# Patient Record
Sex: Male | Born: 1975 | Race: Asian | Hispanic: No | Marital: Married | Smoking: Never smoker
Health system: Southern US, Community
[De-identification: ages and names within clinical notes are randomized; demographics above are authoritative.]

## PROBLEM LIST (undated history)

## (undated) DIAGNOSIS — K5732 Diverticulitis of large intestine without perforation or abscess without bleeding: Secondary | ICD-10-CM

## (undated) DIAGNOSIS — M549 Dorsalgia, unspecified: Secondary | ICD-10-CM

---

## 2005-07-02 ENCOUNTER — Ambulatory Visit: Payer: Self-pay | Admitting: Family Medicine

## 2015-03-20 ENCOUNTER — Ambulatory Visit: Payer: BLUE CROSS/BLUE SHIELD | Attending: Orthopaedic Surgery | Admitting: Physical Therapy

## 2015-03-20 DIAGNOSIS — R29898 Other symptoms and signs involving the musculoskeletal system: Secondary | ICD-10-CM

## 2015-03-20 DIAGNOSIS — M546 Pain in thoracic spine: Secondary | ICD-10-CM

## 2015-03-20 DIAGNOSIS — M545 Low back pain, unspecified: Secondary | ICD-10-CM

## 2015-03-20 NOTE — Therapy (Signed)
[facility] [place] [address]  [address] [place], [place], [address] Phone: [phone]   Fax:  [phone]  Physical Therapy Evaluation  Patient Details  Name: Jonathan Jocksif Guillet MRN: [id number] Date of Birth: 10-09-1976 Referring Provider:  Tarry KosXu, Naiping M, MD  Encounter Date: 03/20/2015      PT End of Session - 03/20/15 1245    Visit Number 1   Number of Visits 8   Date for PT Re-Evaluation 04/17/15   PT Start Time 1027   PT Stop Time 1106   PT Time Calculation (min) 39 min   Activity Tolerance Patient tolerated treatment well   Behavior During Therapy [facility] for tasks assessed/performed      No past medical history on file.  No past surgical history on file.  There were no vitals filed for this visit.  Visit Diagnosis:  Bilateral thoracic back pain - Plan: PT plan of care cert/re-cert  Bilateral low back pain without sciatica - Plan: PT plan of care cert/re-cert  Weakness of both lower extremities - Plan: PT plan of care cert/re-cert      Subjective Assessment - 03/20/15 1028    Subjective Pt is a 39 y/o male who presents to OPPT with sudden onset of LBP on 03/07/15.  Pt reports he developed sudden onset with inability to work.  Pt unable to recall reason for onset.  Pt reports improved symptoms x past 2 weeks.   Limitations House hold activities;Other (comment)  bending on knees; praying 5 times a day   Diagnostic tests x-rays negative   Patient Stated Goals improve strength in back; decrease risk of reinjury   Currently in Pain? No/denies   Pain Location Back   Pain Descriptors / Indicators Discomfort   Pain Onset 1 to 4 weeks ago   Pain Frequency Intermittent   Aggravating Factors  bending over (prays 5x/day with bending)   Pain Relieving Factors medication            [facility] PT Assessment - 03/20/15 1033    Assessment   Medical Diagnosis LBP   Onset Date 03/07/15   Next MD Visit next week   Prior Therapy none  for back   Precautions   Precautions None   Restrictions   Weight Bearing Restrictions No   Balance Screen   Has the patient fallen in the past 6 months No   Has the patient had a decrease in activity level because of a fear of falling?  No  except on treadmill   Is the patient reluctant to leave their home because of a fear of falling?  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#; part time student for logistics management   Leisure watch TV; go to park   Cognition   Overall Cognitive Status Within Functional Limits for tasks assessed   Observation/Other Assessments   Focus on Therapeutic Outcomes (FOTO)  56 (44% limited; predicted 25% limited)   Posture/Postural Control   Posture/Postural Control Postural limitations   Postural Limitations Rounded Shoulders;Forward head;Decreased lumbar lordosis   AROM   Overall AROM Comments Lumbar WNL; min symptoms with L rotation   Strength   Strength Assessment Site Hip;Knee   Right Hip Flexion 5/5   Right Hip Extension 3/5   Right Hip ABduction 4/5   Left Hip  Flexion 5/5   Left Hip Extension 3/5   Left Hip ABduction 4/5   Right/Left Knee Right;Left   Right Knee Flexion 4/5   Right Knee Extension 5/5   Left Knee Flexion 4/5   Left Knee Extension 5/5   Palpation   Palpation tenderness and tightness noted L thoracic paraspinals   Special Tests    Special Tests Lumbar   Straight Leg Raise   Findings Negative   Comment bil                   [facility] Adult PT Treatment/Exercise - 03/20/15 1059    Modalities   Modalities Electrical Stimulation;Moist Heat   Moist Heat Therapy   Number Minutes Moist Heat 15 Minutes   Moist Heat Location Other (comment)  low back   Electrical Stimulation   Electrical  Stimulation Location L thoracic/lumbar spine   Electrical Stimulation Action IFC   Electrical Stimulation Parameters to tolerance x 15 min   Electrical Stimulation Goals Pain                     PT Long Term Goals - 03/20/15 1248    PT LONG TERM GOAL #1   Title independent with HEP (04/10/15)   Time 3   Period Weeks   Status New   PT LONG TERM GOAL #2   Title verbalize understanding of posture/body mechanics to reduce the risk of reinjury (04/10/15)   Time 3   Period Weeks   Status New   PT LONG TERM GOAL #3   Title report ability to work full day without increase in pain (04/10/15)   Time 3   Period Weeks   Status New   PT LONG TERM GOAL #4   Title report ability to perform daily routines including praying 5 times a day without increase in pain (04/10/15)   Time 3   Period Weeks   Status New               Plan - 03/20/15 1246    Clinical Impression Statement Pt presents to OPPT with improving low back pain without radiculopathy.  Pt demonstrates some muscle tightness and soreness as well as hip and core stability weakness.  Pt will benefit from PT to improve strength and function as well as learn how to decrease risk of reinury.   Pt will benefit from skilled therapeutic intervention in order to improve on the following deficits Postural dysfunction;Pain;Decreased strength;Decreased activity tolerance;Improper body mechanics   Rehab Potential Good   PT Frequency 2x / week   PT Duration 3 weeks   PT Treatment/Interventions ADLs/Self Care Home Management;Electrical Stimulation;Moist Heat;Cryotherapy;Traction;Neuromuscular re-education;Ultrasound;Therapeutic exercise;Manual techniques;Therapeutic activities;Functional mobility training;Patient/family education   PT Next Visit Plan HEP for core, hip strengthening and low back flexibility   Consulted and Agree with Plan of Care Patient         Problem List There are no active problems to display for this  patient.  Clarita CraneStephanie F Alexius Hangartner, PT, DPT 03/20/2015 12:52 PM  [facility] [place] [address]  [address] [place], [place], [address] Phone: [phone]   Fax:  [phone]

## 2015-03-23 ENCOUNTER — Ambulatory Visit: Payer: BLUE CROSS/BLUE SHIELD | Admitting: Physical Therapy

## 2015-03-23 DIAGNOSIS — M545 Low back pain, unspecified: Secondary | ICD-10-CM

## 2015-03-23 DIAGNOSIS — M546 Pain in thoracic spine: Secondary | ICD-10-CM

## 2015-03-23 DIAGNOSIS — R29898 Other symptoms and signs involving the musculoskeletal system: Secondary | ICD-10-CM

## 2015-03-23 NOTE — Patient Instructions (Signed)
Knee to Chest   Lying supine, bend knee to chest and hold 30 seconds.  Perform _3__ times. Repeat with other leg. Do _1-3__ times per day.  Copyright  VHI. All rights reserved.   Double Knee to Chest (Flexion)   Gently pull both knees toward chest. Feel stretch in lower back or buttock area. Breathing deeply, Hold _30___ seconds. Repeat __3__ times. Do __1-3__ sessions per day.  http://gt2.exer.us/227   Copyright  VHI. All rights reserved.   Lower Trunk Rotation Stretch   Keeping back flat and feet together, rotate knees to left side. Hold _30___ seconds. Repeat __3__ times per set. Do _1___ sets per session. Do _1-3___ sessions per day.  http://orth.exer.us/122   Copyright  VHI. All rights reserved.   Pelvic Tilt (Flexion)   With feet flat and knees bent, flatten lower back into bed. Tighten stomach muscles. Hold _5___ seconds. Repeat _10___ times. Do __1-3__ sessions per day.  http://gt2.exer.us/229   Copyright  VHI. All rights reserved.   Jonathan CraneStephanie F Karle Buckley, PT, DPT 03/23/2015 9:00 AM  [facility] [place] [address] [place], [place] [address]  [phone] (office) [phone] (fax)

## 2015-03-23 NOTE — Therapy (Signed)
[facility] [place] [address]  [address] [place], [place], [address] Phone: [phone]   Fax:  [phone]  Physical Therapy Treatment  Patient Details  Name: Jonathan Buckley MRN: [id number] Date of Birth: 01/04/1976 Referring Provider:  Tarry KosXu, Naiping M, MD  Encounter Date: 03/23/2015      PT End of Session - 03/23/15 0911    Visit Number 2   Number of Visits 8   Date for PT Re-Evaluation 04/17/15   PT Start Time 0842   PT Stop Time 0927   PT Time Calculation (min) 45 min   Activity Tolerance Patient tolerated treatment well   Behavior During Therapy [facility] for tasks assessed/performed      No past medical history on file.  No past surgical history on file.  There were no vitals filed for this visit.  Visit Diagnosis:  Bilateral thoracic back pain  Bilateral low back pain without sciatica  Weakness of both lower extremities      Subjective Assessment - 03/23/15 0843    Subjective Has had pain since yesterday afternoon; reports increased bending 2 days ago.   Patient Stated Goals improve strength in back; decrease risk of reinjury   Currently in Pain? Yes   Pain Score 8    Pain Location Back   Pain Orientation Left;Mid;Lower   Pain Onset Today   Pain Frequency Intermittent   Aggravating Factors  bending   Pain Relieving Factors meds                         [facility] Adult PT Treatment/Exercise - 03/23/15 0848    Lumbar Exercises: Stretches   Single Knee to Chest Stretch 3 reps;30 seconds   Double Knee to Chest Stretch 3 reps;30 seconds   Lower Trunk Rotation 3 reps;30 seconds   Lumbar Exercises: Aerobic   Stationary Bike NuStep L6 x 8 min   Lumbar Exercises: Supine   Ab Set 10 reps;5 seconds   Modalities   Modalities Electrical Stimulation;Moist Heat   Moist Heat Therapy   Number Minutes Moist Heat 15 Minutes   Moist Heat Location Lumbar Spine   Electrical Stimulation   Electrical Stimulation  Location L thoracic/lumbar spine   Electrical Stimulation Action IFC   Electrical Stimulation Parameters to tolerance x 15 min   Electrical Stimulation Goals Pain                PT Education - 03/23/15 0911    Education provided Yes   Education Details HEP   Person(s) Educated Patient   Methods Explanation;Demonstration;Handout   Comprehension Verbalized understanding;Need further instruction;Returned demonstration             PT Long Term Goals - 03/23/15 0913    PT LONG TERM GOAL #1   Title independent with HEP (04/10/15)   Status On-going   PT LONG TERM GOAL #2   Title verbalize understanding of posture/body mechanics to reduce the risk of reinjury (04/10/15)   Status On-going   PT LONG TERM GOAL #3   Title report ability to work full day without increase in pain (04/10/15)   Status On-going   PT LONG TERM GOAL #4   Title report ability to perform daily routines including praying 5 times a day without increase in pain (04/10/15)   Status On-going               Plan - 03/23/15 0912    Clinical Impression Statement Issued  HEP to address muscle strain from over use working out in yard on Wednesday.  No progress yet as only 2nd visit.   PT Next Visit Plan review HEP; progress core/hip strengthening, low back flexibility   Consulted and Agree with Plan of Care Patient        Problem List There are no active problems to display for this patient.  Clarita CraneStephanie F Ulyess Muto, PT, DPT 03/23/2015 9:28 AM  [facility] [place] [address]  [address] [place], [place], [address] Phone: [phone]   Fax:  [phone]

## 2015-03-26 ENCOUNTER — Encounter: Payer: Self-pay | Admitting: Rehabilitation

## 2015-03-26 ENCOUNTER — Ambulatory Visit: Payer: BLUE CROSS/BLUE SHIELD | Admitting: Rehabilitation

## 2015-03-26 DIAGNOSIS — M546 Pain in thoracic spine: Secondary | ICD-10-CM | POA: Diagnosis not present

## 2015-03-26 DIAGNOSIS — M545 Low back pain, unspecified: Secondary | ICD-10-CM

## 2015-03-26 NOTE — Patient Instructions (Signed)
Emailed to patient HEP addition: bridging x 20, TrA + marching altermating x 15each leg; both 2 times per day

## 2015-03-26 NOTE — Therapy (Signed)
[facility] [place] [address]  [address] [place], [place], [address] Phone: [phone]   Fax:  [phone]  Physical Therapy Treatment  Patient Details  Name: Stark Jocksif Vallez MRN: [id number] Date of Birth: [birth year] Referring Provider:  Tarry KosXu, Naiping M, MD  Encounter Date: 03/26/2015      PT End of Session - 03/26/15 1403    Visit Number 3   Number of Visits 8   Date for PT Re-Evaluation 04/17/15   PT Start Time 1318   PT Stop Time 1415   PT Time Calculation (min) 57 min   Activity Tolerance Patient tolerated treatment well      History reviewed. No pertinent past medical history.  History reviewed. No pertinent past surgical history.  There were no vitals filed for this visit.  Visit Diagnosis:  Bilateral thoracic back pain  Bilateral low back pain without sciatica      Subjective Assessment - 03/26/15 1320    Subjective the pain is just there and  increased when bending down.  Also present at rest.  Had one day where it did not hurt at all.  Does report that the back hurts after working 8-9 hours and that it fefels weak overall.     Currently in Pain? Yes   Pain Score 5    Pain Location --  L thoracic side    Aggravating Factors  bending and praying   Pain Relieving Factors heat, estim, no meds now            [facility] PT Assessment - 03/26/15 0001    AROM   Overall AROM Comments --  pain flexion, sidebend R increasing pain today                     [facility] Adult PT Treatment/Exercise - 03/26/15 0001    Lumbar Exercises: Stretches   Single Knee to Chest Stretch 3 reps;30 seconds   Double Knee to Chest Stretch 3 reps;30 seconds   Lower Trunk Rotation 3 reps;30 seconds   Lumbar Exercises: Aerobic   Stationary Bike NuStep L6 x 5 min   Lumbar Exercises: Supine   Ab Set 10 reps;5 seconds   Bent Knee Raise 10 reps  bil with TRA   Bridge 15 reps   Other Supine Lumbar Exercises cross body L lower  thoracic stretch ; half supine twist 2x20"   Modalities   Modalities Electrical Stimulation;Moist Heat   Moist Heat Therapy   Number Minutes Moist Heat 15 Minutes   Moist Heat Location Lumbar Spine   Electrical Stimulation   Electrical Stimulation Location L thoracic/lumbar spine   Electrical Stimulation Action IFC     Emailed new exercises to patient due to printer not set up; to include bridging, TrA + march, and cross body supine twist stretch  Education on importance on strengthening to prevent back pain during work/in the future                PT Long Term Goals - 03/23/15 0913    PT LONG TERM GOAL #1   Title independent with HEP (04/10/15)   Status On-going   PT LONG TERM GOAL #2   Title verbalize understanding of posture/body mechanics to reduce the risk of reinjury (04/10/15)   Status On-going   PT LONG TERM GOAL #3   Title report ability to work full day without increase in pain (04/10/15)   Status On-going   PT LONG TERM GOAL #4  Title report ability to perform daily routines including praying 5 times a day without increase in pain (04/10/15)   Status On-going               Plan - 03/26/15 1404    Clinical Impression Statement started to increase lumbar strengthening TE with good tolerance.     PT Next Visit Plan review HEP; progress core/hip strengthening, low back flexibility   Consulted and Agree with Plan of Care Patient        Problem List There are no active problems to display for this patient.   Idamae Lusherevis, Shenita Trego R, DPT CMP 03/26/2015, 3:47 PM  [facility] [place] [address]  [address] [place], [place], [address] Phone: [phone]   Fax:  [phone]

## 2015-03-29 ENCOUNTER — Ambulatory Visit: Payer: BLUE CROSS/BLUE SHIELD | Admitting: Rehabilitation

## 2015-03-29 DIAGNOSIS — M545 Low back pain, unspecified: Secondary | ICD-10-CM

## 2015-03-29 DIAGNOSIS — R29898 Other symptoms and signs involving the musculoskeletal system: Secondary | ICD-10-CM

## 2015-03-29 DIAGNOSIS — M546 Pain in thoracic spine: Secondary | ICD-10-CM | POA: Diagnosis not present

## 2015-03-29 NOTE — Therapy (Signed)
[facility] [place] [address]  [address] [place], [place], [address] Phone: [phone]   Fax:  [phone]  Physical Therapy Treatment  Patient Details  Name: Jonathan Buckley MRN: [id number] Date of Birth: 1976/02/23 Referring Provider:  Tarry KosXu, Naiping M, MD  Encounter Date: 03/29/2015      PT End of Session - 03/29/15 0801    Visit Number 4   Number of Visits 8   Date for PT Re-Evaluation 04/17/15   PT Start Time 0800   PT Stop Time 0850   PT Time Calculation (min) 50 min      No past medical history on file.  No past surgical history on file.  There were no vitals filed for this visit.  Visit Diagnosis:  Bilateral thoracic back pain  Bilateral low back pain without sciatica  Weakness of both lower extremities      Subjective Assessment - 03/29/15 0803    Subjective Reports pain increases everytime he comes in here. Reports no change in pain yet. Did not recieve exercises through the email and would like them to be reprinted.    Currently in Pain? Yes   Pain Score --  7-8/10   Pain Location --   bilateral thoracic/lower back (Lt more than Rt)   Pain Orientation Left;Mid;Lower                         [facility] Treatment/Exercise - 03/29/15 0810    Lumbar Exercises: Stretches   Single Knee to Chest Stretch 3 reps;30 seconds   Double Knee to Chest Stretch 3 reps;30 seconds   Lower Trunk Rotation --  in side-lying 3"x10   Piriformis Stretch 3 reps;30 seconds   Lumbar Exercises: Aerobic   Stationary Bike NuStep L6 x 8 min   Lumbar Exercises: Supine   Bent Knee Raise 15 reps;3 seconds   Bridge 15 reps;3 seconds   Straight Leg Raise 3 seconds;10 reps  with TrA contraction   Other Supine Lumbar Exercises alt march/UE lift 3"x10   Modalities   Modalities Electrical Stimulation;Moist Heat   Moist Heat Therapy   Number Minutes Moist Heat 15 Minutes   Moist Heat Location Lumbar Spine   Electrical Stimulation   Electrical Stimulation Location L thoracic/lumbar spine   Electrical Stimulation Action IFC   Electrical Stimulation Parameters to tolerance with pt prone   Electrical Stimulation Goals Pain                PT Education - 03/29/15 0810    Education provided Yes   Education Details HEP from earlier in the week due to pt not recieving it.    Person(s) Educated Patient   Methods Explanation;Demonstration;Handout   Comprehension Verbalized understanding             PT Long Term Goals - 03/23/15 0913    PT LONG TERM GOAL #1   Title independent with HEP (04/10/15)   Status On-going   PT LONG TERM GOAL #2   Title verbalize understanding of posture/body mechanics to reduce the risk of reinjury (04/10/15)   Status On-going   PT LONG TERM GOAL #3   Title report ability to work full day without increase in pain (04/10/15)   Status On-going   PT LONG TERM GOAL #4   Title report ability to perform daily routines including praying 5 times a day without increase in pain (04/10/15)   Status On-going  Plan - 03/29/15 0834    Clinical Impression Statement Good tolerance to exercises today, pt reports no change in pain yet. Was able to print off exercises from last time and today and give to him. Pt seems very compliant with HEP and motivated to get better.    PT Next Visit Plan Continue core/hip strengthening, low/middle back flexibility   Consulted and Agree with Plan of Care Patient        Problem List There are no active problems to display for this patient.   Ronney LionDUCKER, Janete Quilling J, PTA 03/29/2015, 8:36 AM  [facility] [place] [address]  [address] [place], [place], [address] Phone: [phone]   Fax:  [phone]

## 2015-03-30 ENCOUNTER — Encounter: Payer: BLUE CROSS/BLUE SHIELD | Admitting: Physical Therapy

## 2015-04-03 ENCOUNTER — Ambulatory Visit: Payer: BLUE CROSS/BLUE SHIELD | Admitting: Rehabilitation

## 2015-04-03 DIAGNOSIS — M545 Low back pain, unspecified: Secondary | ICD-10-CM

## 2015-04-03 DIAGNOSIS — M546 Pain in thoracic spine: Secondary | ICD-10-CM

## 2015-04-03 DIAGNOSIS — R29898 Other symptoms and signs involving the musculoskeletal system: Secondary | ICD-10-CM

## 2015-04-03 NOTE — Therapy (Addendum)
[place] [address]  [place], [place], [address] Phone: [phone]   Fax:  [phone]  Physical Therapy Treatment  Patient Details  Name: Jonathan Buckley MRN: [id number] Date of Birth: [birth year] Referring Provider:  Leandrew Koyanagi, MD  Encounter Date: 04/03/2015      PT End of Session - 04/03/15 0853    Visit Number 5   Number of Visits 8   Date for PT Re-Evaluation 04/17/15   PT Start Time 4970   PT Stop Time 0940   PT Time Calculation (min) 53 min      No past medical history on file.  No past surgical history on file.  There were no vitals filed for this visit.  Visit Diagnosis:  Bilateral thoracic back pain  Bilateral low back pain without sciatica  Weakness of both lower extremities      Subjective Assessment - 04/03/15 0850    Subjective Reports pain still hasn't changed much but MD put him on another round of prednizone and states he has noticed a difference this time. Pt couldn't remember the dosage but states he only has 2 more days left.      Currently in Pain? Yes   Pain Score 6    Pain Location --  Bilateral thoracic/lumbar (Lt more then Rt)   Pain Orientation Left;Right;Mid;Lower                         [facility] Adult PT Treatment/Exercise - 04/03/15 0855    Exercises   Exercises Lumbar   Lumbar Exercises: Stretches   Double Knee to Chest Stretch 3 reps;30 seconds   Piriformis Stretch 3 reps;30 seconds  bilateral   Lumbar Exercises: Aerobic   Stationary Bike NuStep L6 x 8 min   Lumbar Exercises: Supine   Bridge 10 reps  10 second holds   Other Supine Lumbar Exercises bridge with alt knee extension x10   Other Supine Lumbar Exercises alt march/UE lift 3"x10   Lumbar Exercises: Sidelying   Clam 10 reps;3 seconds  bilateral   Other Sidelying Lumbar Exercises open book/horiz abduction 3" x10   Lumbar Exercises: Quadruped   [place] 5 reps  10" holds    Straight Leg Raise 10 reps;3 seconds                PT Education - 04/03/15 0931    Education provided Yes   Education Details HEP   Person(s) Educated Patient   Methods Explanation;Demonstration;Handout   Comprehension Verbalized understanding             PT Long Term Goals - 03/23/15 0913    PT LONG TERM GOAL #1   Title independent with HEP (04/10/15)   Status On-going   PT LONG TERM GOAL #2   Title verbalize understanding of posture/body mechanics to reduce the risk of reinjury (04/10/15)   Status On-going   PT LONG TERM GOAL #3   Title report ability to work full day without increase in pain (04/10/15)   Status On-going   PT LONG TERM GOAL #4   Title report ability to perform daily routines including praying 5 times a day without increase in pain (04/10/15)   Status On-going               Plan - 04/03/15 0932    Clinical Impression Statement Good tolerance to increased exercise today without complaint of pain. Reviews current HEP and addition  today with pt performing stretches everyday and alternating strengthening every other day.    PT Next Visit Plan Continue core/hip strengthening, low/middle back flexibility   Consulted and Agree with Plan of Care Patient        Problem List There are no active problems to display for this patient.   Barbette Hair, PTA 04/03/2015, 9:33 AM  [facility] [address]  [place], [place], [address] Phone: [phone]   Fax:  [phone]     PHYSICAL THERAPY DISCHARGE SUMMARY  Visits from Start of Care: 5  Current functional level related to goals / functional outcomes: See above; pt hospitalized and therefore therapy services discharged   Remaining deficits: unknown   Education / Equipment: HEP  Plan: Patient agrees to discharge.  Patient goals were not met. Patient is being discharged due to a change in medical status.  ?????    Will need new order to return to PT.  Laureen Abrahams, PT, DPT 05/10/2015 2:17 PM   [facility] [place], [place] [address]  [phone] (office) [phone] (fax)

## 2015-04-05 ENCOUNTER — Ambulatory Visit: Payer: BLUE CROSS/BLUE SHIELD

## 2015-04-06 ENCOUNTER — Emergency Department (HOSPITAL_BASED_OUTPATIENT_CLINIC_OR_DEPARTMENT_OTHER): Payer: BLUE CROSS/BLUE SHIELD

## 2015-04-06 ENCOUNTER — Inpatient Hospital Stay (HOSPITAL_BASED_OUTPATIENT_CLINIC_OR_DEPARTMENT_OTHER)
Admission: EM | Admit: 2015-04-06 | Discharge: 2015-04-17 | DRG: 357 | Disposition: A | Discharge status: Home / Self Care | Payer: BLUE CROSS/BLUE SHIELD | Attending: Surgery | Admitting: Surgery

## 2015-04-06 ENCOUNTER — Encounter (HOSPITAL_BASED_OUTPATIENT_CLINIC_OR_DEPARTMENT_OTHER): Payer: Self-pay | Admitting: Emergency Medicine

## 2015-04-06 ENCOUNTER — Other Ambulatory Visit (HOSPITAL_BASED_OUTPATIENT_CLINIC_OR_DEPARTMENT_OTHER): Payer: Self-pay

## 2015-04-06 ENCOUNTER — Other Ambulatory Visit: Payer: Self-pay | Admitting: Physician Assistant

## 2015-04-06 DIAGNOSIS — K651 Peritoneal abscess: Secondary | ICD-10-CM | POA: Insufficient documentation

## 2015-04-06 DIAGNOSIS — R000 Tachycardia, unspecified: Secondary | ICD-10-CM | POA: Diagnosis present

## 2015-04-06 DIAGNOSIS — R350 Frequency of micturition: Secondary | ICD-10-CM | POA: Diagnosis present

## 2015-04-06 DIAGNOSIS — R109 Unspecified abdominal pain: Secondary | ICD-10-CM

## 2015-04-06 DIAGNOSIS — K5660 Unspecified intestinal obstruction: Secondary | ICD-10-CM | POA: Diagnosis present

## 2015-04-06 DIAGNOSIS — K5720 Diverticulitis of large intestine with perforation and abscess without bleeding: Secondary | ICD-10-CM | POA: Diagnosis not present

## 2015-04-06 DIAGNOSIS — R1084 Generalized abdominal pain: Secondary | ICD-10-CM

## 2015-04-06 HISTORY — DX: Diverticulitis of large intestine without perforation or abscess without bleeding: K57.32

## 2015-04-06 HISTORY — DX: Dorsalgia, unspecified: M54.9

## 2015-04-06 LAB — URINALYSIS, ROUTINE W REFLEX MICROSCOPIC
Bilirubin Urine: NEGATIVE
Glucose, UA: NEGATIVE mg/dL
Hgb urine dipstick: NEGATIVE
Ketones, ur: NEGATIVE mg/dL
Leukocytes, UA: NEGATIVE
Nitrite: NEGATIVE
PH: 7 (ref 5.0–8.0)
PROTEIN: NEGATIVE mg/dL
SPECIFIC GRAVITY, URINE: 1.022 (ref 1.005–1.030)
Urobilinogen, UA: 0.2 mg/dL (ref 0.0–1.0)

## 2015-04-06 LAB — CBC WITH DIFFERENTIAL/PLATELET
Basophils Absolute: 0 10*3/uL (ref 0.0–0.1)
Basophils Relative: 0 % (ref 0–1)
Eosinophils Absolute: 0.2 10*3/uL (ref 0.0–0.7)
Eosinophils Relative: 2 % (ref 0–5)
HCT: 44.2 % (ref 39.0–52.0)
HEMOGLOBIN: 15 g/dL (ref 13.0–17.0)
Lymphocytes Relative: 18 % (ref 12–46)
Lymphs Abs: 2.4 10*3/uL (ref 0.7–4.0)
MCH: 28.7 pg (ref 26.0–34.0)
MCHC: 33.9 g/dL (ref 30.0–36.0)
MCV: 84.7 fL (ref 78.0–100.0)
MONO ABS: 1.1 10*3/uL — AB (ref 0.1–1.0)
Monocytes Relative: 8 % (ref 3–12)
Neutro Abs: 9.7 10*3/uL — ABNORMAL HIGH (ref 1.7–7.7)
Neutrophils Relative %: 72 % (ref 43–77)
Platelets: 261 10*3/uL (ref 150–400)
RBC: 5.22 MIL/uL (ref 4.22–5.81)
RDW: 13.7 % (ref 11.5–15.5)
WBC: 13.4 10*3/uL — ABNORMAL HIGH (ref 4.0–10.5)

## 2015-04-06 LAB — COMPREHENSIVE METABOLIC PANEL
ALT: 23 U/L (ref 17–63)
AST: 19 U/L (ref 15–41)
Albumin: 3.8 g/dL (ref 3.5–5.0)
Alkaline Phosphatase: 67 U/L (ref 38–126)
Anion gap: 12 (ref 5–15)
BILIRUBIN TOTAL: 0.7 mg/dL (ref 0.3–1.2)
BUN: 10 mg/dL (ref 6–20)
CHLORIDE: 98 mmol/L — AB (ref 101–111)
CO2: 23 mmol/L (ref 22–32)
CREATININE: 0.77 mg/dL (ref 0.61–1.24)
Calcium: 9.2 mg/dL (ref 8.9–10.3)
Glucose, Bld: 125 mg/dL — ABNORMAL HIGH (ref 65–99)
Potassium: 3.9 mmol/L (ref 3.5–5.1)
Sodium: 133 mmol/L — ABNORMAL LOW (ref 135–145)
Total Protein: 8.8 g/dL — ABNORMAL HIGH (ref 6.5–8.1)

## 2015-04-06 LAB — LIPASE, BLOOD: LIPASE: 23 U/L (ref 22–51)

## 2015-04-06 MED ORDER — ONDANSETRON HCL 4 MG/2ML IJ SOLN
4.0000 mg | Freq: Once | INTRAMUSCULAR | Status: AC
  Administered 2015-04-06: 4 mg via INTRAVENOUS
  Filled 2015-04-06: qty 2

## 2015-04-06 MED ORDER — SODIUM CHLORIDE 0.9 % IV BOLUS (SEPSIS)
1000.0000 mL | Freq: Once | INTRAVENOUS | Status: AC
  Administered 2015-04-06: 1000 mL via INTRAVENOUS

## 2015-04-06 MED ORDER — MORPHINE SULFATE 4 MG/ML IJ SOLN
4.0000 mg | Freq: Once | INTRAMUSCULAR | Status: AC
  Administered 2015-04-06: 4 mg via INTRAVENOUS
  Filled 2015-04-06: qty 1

## 2015-04-06 MED ORDER — IOHEXOL 300 MG/ML  SOLN
100.0000 mL | Freq: Once | INTRAMUSCULAR | Status: AC | PRN
  Administered 2015-04-06: 100 mL via INTRAVENOUS

## 2015-04-06 NOTE — ED Provider Notes (Addendum)
CSN: [id number]     Arrival date & time 04/06/15  2205 History  This chart was scribed for Gwyneth Sprout, MD by Phillis Haggis, ED Scribe. This patient was seen in room MH06/MH06 and patient care was started at 10:15 PM.   Chief Complaint  Patient presents with  . Abdominal Pain   The history is provided by the patient. No language interpreter was used.   HPI Comments: Jonathan Buckley is a 39 y.o. male who presents to the Emergency Department complaining of periumbilical abdominal pain onset 3 days ago. He states that he has barely eaten anything, nausea, and vomiting. He reports intermittent fever since Wednesday night, tmax 101 F. He states that he also had diarrhea Wednesday night but is not currently having any episodes. He states that he feels a lot of discomfort in his abdomen. He states that he was seen at Urgent Care today and was given anti-nausea medication to no relief. He states that he has also been taking OTC medications, such as a bottle of Pepto Bismol, to no relief. He states that he was told at Urgent Care to have a CT scan because of an abnormal lab workup, but was not given much information as to why he should get one. He states that he just moved to the area from [place]. He denies any recent travel outside of the country and any sick contacts.   No past medical history on file. No past surgical history on file. No family history on file. History  Substance Use Topics  . Smoking status: Not on file  . Smokeless tobacco: Not on file  . Alcohol Use: Not on file    Review of Systems A complete 10 system review of systems was obtained and all systems are negative except as noted in the HPI and PMH.   Allergies  Review of patient's allergies indicates not on file.  Home Medications   Prior to Admission medications   Not on File   BP 136/93 mmHg  Pulse 120  Temp(Src) 99.5 F (37.5 C) (Oral)  Resp 16  SpO2 100%   Physical Exam  Constitutional: He is oriented to  person, place, and time. He appears well-developed and well-nourished.  HENT:  Head: Normocephalic and atraumatic.  Mouth/Throat: Oropharynx is clear and moist.  Eyes: EOM are normal. Pupils are equal, round, and reactive to light.  Neck: Normal range of motion. Neck supple.  Cardiovascular: Intact distal pulses.  Tachycardia present.   No murmur heard. Pulmonary/Chest: Effort normal and breath sounds normal. He has no wheezes.  Abdominal: Soft. Normal appearance. He exhibits no distension. There is tenderness in the right lower quadrant and periumbilical area. There is rebound, guarding and tenderness at McBurney's point. There is no CVA tenderness and negative Murphy's sign.  Periumbilical tenderness  Musculoskeletal: Normal range of motion.  Neurological: He is alert and oriented to person, place, and time.  Skin: Skin is warm and dry.  Psychiatric: He has a normal mood and affect. His behavior is normal.  Nursing note and vitals reviewed.   ED Course  Procedures (including critical care time) DIAGNOSTIC STUDIES: Oxygen Saturation is 100% on room air, normal by my interpretation.    COORDINATION OF CARE: 10:20 PM-Discussed treatment plan which includes IV fluids and CT scan with pt at bedside and pt agreed to plan.   Labs Review Labs Reviewed  CBC WITH DIFFERENTIAL/PLATELET - Abnormal; Notable for the following:    WBC 13.4 (*)    Neutro Abs 9.7 (*)  Monocytes Absolute 1.1 (*)    All other components within normal limits  COMPREHENSIVE METABOLIC PANEL - Abnormal; Notable for the following:    Sodium 133 (*)    Chloride 98 (*)    Glucose, Bld 125 (*)    Total Protein 8.8 (*)    All other components within normal limits  LIPASE, BLOOD  URINALYSIS, ROUTINE W REFLEX MICROSCOPIC (NOT [facility])   Imaging Review Ct Abdomen Pelvis W Contrast  04/07/2015   CLINICAL DATA:  Periumbilical pain, onset 3 days ago.  EXAM: CT ABDOMEN AND PELVIS WITH CONTRAST  TECHNIQUE: Multidetector  CT imaging of the abdomen and pelvis was performed using the standard protocol following bolus administration of intravenous contrast.  CONTRAST:  100mL OMNIPAQUE IOHEXOL 300 MG/ML  SOLN  COMPARISON:  None.  FINDINGS: BODY WALL: No contributory findings.  LOWER CHEST: No contributory findings.  ABDOMEN/PELVIS:  Liver: No focal abnormality.  Biliary: No evidence of biliary obstruction or stone.  Pancreas: Unremarkable.  Spleen: Unremarkable.  Adrenals: Unremarkable.  Kidneys and ureters: 3 left renal calculi, up to 3 mm. No hydronephrosis or ureteral calculus  Bladder: Unremarkable.  Reproductive: No pathologic findings.  Bowel: There is thickening of the sigmoid colon where there are inflamed diverticula and extraluminal gas measuring 2 cm in the interloop peritoneal compartment. The surrounding fat is inflamed and the regional small bowel loops are dilated with fecalized contents. There is mild small bowel wall thickening at the level of the gas, compatible with a focal reactive enteritis. Primary small bowel perforation is considered less likely given the overall appearance. Additionally, no foreign body is seen. No remote pneumoperitoneum. Small ascites without loculation. No appendicitis.  Vascular: No acute abnormality.  OSSEOUS: No acute abnormalities.  These results were called by telephone at the time of interpretation on 04/07/2015 at 12:02 am to Dr. Gwyneth SproutWHITNEY [name] , who verbally acknowledged these results.  IMPRESSION: 1. Sigmoid diverticulitis with contained gaseous perforation. There is reactive enteritis and focal ileus. 2. Left nephrolithiasis.   Electronically Signed   By: Marnee SpringJonathon  Watts M.D.   On: 04/07/2015 00:03     EKG Interpretation None      MDM   Final diagnoses:  Abdominal pain  Diverticulitis of large intestine with perforation without bleeding    Pt with sx concerning for appendicitis vs diverticulitis.  No urinary complaints and low suspicion for renal or biliary colic.  No  etoh use or concern for pancreatitis.  On exam periumbilical and rlq pain with rebound and guarding.  Leukocytosis of 13,000 and nml cmp and ua.  Ct pending.  12:14 AM CT with perforated diverticulitis that is contained at this time.  Pt started on zosyn for moderate disease.  Will discuss with Dr. Johna SheriffHoxworth.   I personally performed the services described in this documentation, which was scribed in my presence.  The recorded information has been reviewed and considered.    Gwyneth SproutWhitney Larence Thone, MD 04/07/15 [id number]  Gwyneth SproutWhitney Gianmarco Roye, MD 04/07/15 [id number]

## 2015-04-06 NOTE — ED Notes (Signed)
Patient reports that he has had abdominal pain and N/V  With intermittent fever for about 2 -3 days. Patient presented for CT and the patient felt like he was worse and wants to check in for more nausea medications and then pain meds because he is still vomiting and nausea after the meds given at his primary care MD

## 2015-04-07 ENCOUNTER — Encounter (HOSPITAL_COMMUNITY): Payer: Self-pay | Admitting: *Deleted

## 2015-04-07 DIAGNOSIS — K5660 Unspecified intestinal obstruction: Secondary | ICD-10-CM | POA: Diagnosis present

## 2015-04-07 DIAGNOSIS — R000 Tachycardia, unspecified: Secondary | ICD-10-CM | POA: Diagnosis present

## 2015-04-07 DIAGNOSIS — K5720 Diverticulitis of large intestine with perforation and abscess without bleeding: Secondary | ICD-10-CM | POA: Diagnosis present

## 2015-04-07 DIAGNOSIS — R350 Frequency of micturition: Secondary | ICD-10-CM | POA: Diagnosis present

## 2015-04-07 DIAGNOSIS — R109 Unspecified abdominal pain: Secondary | ICD-10-CM | POA: Diagnosis present

## 2015-04-07 DIAGNOSIS — K5732 Diverticulitis of large intestine without perforation or abscess without bleeding: Secondary | ICD-10-CM

## 2015-04-07 HISTORY — DX: Diverticulitis of large intestine without perforation or abscess without bleeding: K57.32

## 2015-04-07 MED ORDER — PIPERACILLIN-TAZOBACTAM 3.375 G IVPB
3.3750 g | Freq: Once | INTRAVENOUS | Status: AC
  Administered 2015-04-07: 3.375 g via INTRAVENOUS
  Filled 2015-04-07: qty 50

## 2015-04-07 MED ORDER — ONDANSETRON HCL 4 MG/2ML IJ SOLN
4.0000 mg | Freq: Once | INTRAMUSCULAR | Status: AC
  Administered 2015-04-07: 4 mg via INTRAVENOUS
  Filled 2015-04-07: qty 2

## 2015-04-07 MED ORDER — PIPERACILLIN-TAZOBACTAM 3.375 G IVPB
3.3750 g | Freq: Three times a day (TID) | INTRAVENOUS | Status: DC
  Administered 2015-04-07 – 2015-04-17 (×31): 3.375 g via INTRAVENOUS
  Filled 2015-04-07 (×35): qty 50

## 2015-04-07 MED ORDER — ONDANSETRON HCL 4 MG/2ML IJ SOLN
4.0000 mg | Freq: Four times a day (QID) | INTRAMUSCULAR | Status: DC | PRN
  Administered 2015-04-07 – 2015-04-14 (×23): 4 mg via INTRAVENOUS
  Filled 2015-04-07 (×23): qty 2

## 2015-04-07 MED ORDER — SODIUM CHLORIDE 0.9 % IV SOLN
INTRAVENOUS | Status: AC
  Administered 2015-04-07: 01:00:00 via INTRAVENOUS

## 2015-04-07 MED ORDER — MORPHINE SULFATE 2 MG/ML IJ SOLN
2.0000 mg | INTRAMUSCULAR | Status: DC | PRN
  Administered 2015-04-07 (×2): 4 mg via INTRAVENOUS
  Administered 2015-04-07: 2 mg via INTRAVENOUS
  Administered 2015-04-08 – 2015-04-09 (×9): 4 mg via INTRAVENOUS
  Administered 2015-04-09: 2 mg via INTRAVENOUS
  Administered 2015-04-10 – 2015-04-14 (×16): 4 mg via INTRAVENOUS
  Administered 2015-04-15: 2 mg via INTRAVENOUS
  Filled 2015-04-07 (×16): qty 2
  Filled 2015-04-07: qty 1
  Filled 2015-04-07 (×10): qty 2
  Filled 2015-04-07: qty 1
  Filled 2015-04-07 (×2): qty 2

## 2015-04-07 MED ORDER — KCL IN DEXTROSE-NACL 20-5-0.9 MEQ/L-%-% IV SOLN
INTRAVENOUS | Status: DC
  Administered 2015-04-07 – 2015-04-17 (×12): via INTRAVENOUS
  Filled 2015-04-07 (×22): qty 1000

## 2015-04-07 MED ORDER — ENOXAPARIN SODIUM 40 MG/0.4ML ~~LOC~~ SOLN
40.0000 mg | SUBCUTANEOUS | Status: DC
  Administered 2015-04-07: 40 mg via SUBCUTANEOUS
  Filled 2015-04-07 (×4): qty 0.4

## 2015-04-07 NOTE — ED Notes (Signed)
Pt made aware of bed assignment 

## 2015-04-07 NOTE — H&P (Signed)
Jonathan Buckley is an 39 y.o. male.    Chief Complaint: nausea, abdominal pain, fever  HPI: he was in his usual state of health until 3 days ago when he developed initially nausea without vomiting and diarrhea. He felt generally ill and had some subjective fever and chills as well. This continued for a couple of days and then yesterday he began to develop the gradual onset of abdominal pain. The pain is moderately severe and located in the mid lower abdomen just beneath his umbilicus. No relieving or exacerbating factors. The diarrhea stopped yesterday but he continued to have pain and nausea and felt chilled. He presented to [facility] for evaluation and the CT scan below was obtained. He has no previous history of GI disease. Denies hematochezia or melena. He has some chronic urinary frequency which has been evaluated by urologist in the past and this is no different.  Past Medical History  Diagnosis Date  . Back ache     History reviewed. No pertinent past surgical history.  History reviewed. No pertinent family history. Social History:  reports that he has never smoked. He does not have any smokeless tobacco history on file. He reports that he does not drink alcohol or use illicit drugs.  Allergies: No Known Allergies  Medications Prior to Admission  Medication Sig Dispense Refill  . ALPRAZolam (XANAX) 0.5 MG tablet Take 0.5 mg by mouth daily.    Marland Kitchen ibuprofen (ADVIL,MOTRIN) 200 MG tablet Take 400 mg by mouth every 6 (six) hours as needed for moderate pain.    Marland Kitchen tiZANidine (ZANAFLEX) 2 MG tablet Take 2 mg by mouth every 6 (six) hours as needed for muscle spasms.      Results for orders placed or performed during the hospital encounter of 04/06/15 (from the past 48 hour(s))  CBC with Differential/Platelet     Status: Abnormal   Collection Time: 04/06/15 10:28 PM  Result Value Ref Range   WBC 13.4 (H) 4.0 - 10.5 K/uL   RBC 5.22 4.22 - 5.81 MIL/uL   Hemoglobin 15.0 13.0 - 17.0  g/dL   HCT 44.2 39.0 - 52.0 %   MCV 84.7 78.0 - 100.0 fL   MCH 28.7 26.0 - 34.0 pg   MCHC 33.9 30.0 - 36.0 g/dL   RDW 13.7 11.5 - 15.5 %   Platelets 261 150 - 400 K/uL   Neutrophils Relative % 72 43 - 77 %   Neutro Abs 9.7 (H) 1.7 - 7.7 K/uL   Lymphocytes Relative 18 12 - 46 %   Lymphs Abs 2.4 0.7 - 4.0 K/uL   Monocytes Relative 8 3 - 12 %   Monocytes Absolute 1.1 (H) 0.1 - 1.0 K/uL   Eosinophils Relative 2 0 - 5 %   Eosinophils Absolute 0.2 0.0 - 0.7 K/uL   Basophils Relative 0 0 - 1 %   Basophils Absolute 0.0 0.0 - 0.1 K/uL  Comprehensive metabolic panel     Status: Abnormal   Collection Time: 04/06/15 10:28 PM  Result Value Ref Range   Sodium 133 (L) 135 - 145 mmol/L   Potassium 3.9 3.5 - 5.1 mmol/L   Chloride 98 (L) 101 - 111 mmol/L   CO2 23 22 - 32 mmol/L   Glucose, Bld 125 (H) 65 - 99 mg/dL   BUN 10 6 - 20 mg/dL   Creatinine, Ser 0.77 0.61 - 1.24 mg/dL   Calcium 9.2 8.9 - 10.3 mg/dL   Total Protein 8.8 (H) 6.5 -  8.1 g/dL   Albumin 3.8 3.5 - 5.0 g/dL   AST 19 15 - 41 U/L   ALT 23 17 - 63 U/L   Alkaline Phosphatase 67 38 - 126 U/L   Total Bilirubin 0.7 0.3 - 1.2 mg/dL   GFR calc non Af Amer >60 >60 mL/min   GFR calc Af Amer >60 >60 mL/min    Comment: (NOTE) The eGFR has been calculated using the CKD EPI equation. This calculation has not been validated in all clinical situations. eGFR's persistently <60 mL/min signify possible Chronic Kidney Disease.    Anion gap 12 5 - 15  Lipase, blood     Status: None   Collection Time: 04/06/15 10:28 PM  Result Value Ref Range   Lipase 23 22 - 51 U/L  Urinalysis, Routine w reflex microscopic (not at [facility])     Status: None   Collection Time: 04/06/15 11:39 PM  Result Value Ref Range   Color, Urine YELLOW YELLOW   APPearance CLEAR CLEAR   Specific Gravity, Urine 1.022 1.005 - 1.030   pH 7.0 5.0 - 8.0   Glucose, UA NEGATIVE NEGATIVE mg/dL   Hgb urine dipstick NEGATIVE NEGATIVE   Bilirubin Urine NEGATIVE NEGATIVE    Ketones, ur NEGATIVE NEGATIVE mg/dL   Protein, ur NEGATIVE NEGATIVE mg/dL   Urobilinogen, UA 0.2 0.0 - 1.0 mg/dL   Nitrite NEGATIVE NEGATIVE   Leukocytes, UA NEGATIVE NEGATIVE    Comment: MICROSCOPIC NOT DONE ON URINES WITH NEGATIVE PROTEIN, BLOOD, LEUKOCYTES, NITRITE, OR GLUCOSE <1000 mg/dL.   Ct Abdomen Pelvis W Contrast  04/07/2015   CLINICAL DATA:  Periumbilical pain, onset 3 days ago.  EXAM: CT ABDOMEN AND PELVIS WITH CONTRAST  TECHNIQUE: Multidetector CT imaging of the abdomen and pelvis was performed using the standard protocol following bolus administration of intravenous contrast.  CONTRAST:  116mL OMNIPAQUE IOHEXOL 300 MG/ML  SOLN  COMPARISON:  None.  FINDINGS: BODY WALL: No contributory findings.  LOWER CHEST: No contributory findings.  ABDOMEN/PELVIS:  Liver: No focal abnormality.  Biliary: No evidence of biliary obstruction or stone.  Pancreas: Unremarkable.  Spleen: Unremarkable.  Adrenals: Unremarkable.  Kidneys and ureters: 3 left renal calculi, up to 3 mm. No hydronephrosis or ureteral calculus  Bladder: Unremarkable.  Reproductive: No pathologic findings.  Bowel: There is thickening of the sigmoid colon where there are inflamed diverticula and extraluminal gas measuring 2 cm in the interloop peritoneal compartment. The surrounding fat is inflamed and the regional small bowel loops are dilated with fecalized contents. There is mild small bowel wall thickening at the level of the gas, compatible with a focal reactive enteritis. Primary small bowel perforation is considered less likely given the overall appearance. Additionally, no foreign body is seen. No remote pneumoperitoneum. Small ascites without loculation. No appendicitis.  Vascular: No acute abnormality.  OSSEOUS: No acute abnormalities.  These results were called by telephone at the time of interpretation on 04/07/2015 at 12:02 am to Dr. Blanchie Dessert , who verbally acknowledged these results.  IMPRESSION: 1. Sigmoid diverticulitis  with contained gaseous perforation. There is reactive enteritis and focal ileus. 2. Left nephrolithiasis.   Electronically Signed   By: Monte Fantasia M.D.   On: 04/07/2015 00:03    Review of Systems  Constitutional: Positive for fever, chills and malaise/fatigue.  HENT: Negative.   Respiratory: Negative.   Cardiovascular: Negative.   Gastrointestinal: Positive for nausea, abdominal pain and diarrhea. Negative for vomiting, constipation, blood in stool and melena.  Genitourinary: Positive for frequency. Negative  for dysuria, urgency and hematuria.  Musculoskeletal: Positive for back pain.  Psychiatric/Behavioral: The patient is nervous/anxious.     Blood pressure 122/93, pulse 92, temperature 97.7 F (36.5 C), temperature source Oral, resp. rate 18, height $RemoveBe'5\' 11"'UaeKMiXCK$  (1.803 m), weight 92.1 kg (203 lb 0.7 oz), SpO2 98 %. Physical Exam  General: Alert, well-developed male, in no distress Skin: Warm and dry without rash or infection. HEENT: No palpable masses or thyromegaly. Sclera nonicteric. Pupils equal round and reactive. Oropharynx clear. Lymph nodes: No cervical, supraclavicular, or inguinal nodes palpable. Lungs: Breath sounds clear and equal without increased work of breathing Cardiovascular: Regular rate and rhythm without murmur. No JVD or edema. Peripheral pulses intact. Abdomen: Nondistended. There is mild diffuse lower abdominal tenderness slightly greater on the left side but no guarding or peritoneal signs. No masses palpable. No organomegaly. No palpable hernias. Extremities: No edema or joint swelling or deformity. No chronic venous stasis changes. Neurologic: Alert and fully oriented. Aspect and thought process is normal.  Assessment/Plan Acute illness with lower abdominal pain and fever and nausea. CT scan is most consistent with walled off perforated sigmoid diverticulitis. Small bowel source possible but felt to be much less likely. Patient is stable with a very benign  abdominal exam. Treat nonoperatively with bowel rest, IV fluids and IV antibiotics. Diagnosis and treatment discussed with the patient and all his questions answered.  Mone Commisso T 04/07/2015, 7:28 AM

## 2015-04-07 NOTE — Progress Notes (Signed)
Utilization review completed.  

## 2015-04-08 LAB — CBC
HEMATOCRIT: 40 % (ref 39.0–52.0)
Hemoglobin: 13.6 g/dL (ref 13.0–17.0)
MCH: 29.3 pg (ref 26.0–34.0)
MCHC: 34 g/dL (ref 30.0–36.0)
MCV: 86.2 fL (ref 78.0–100.0)
PLATELETS: 247 10*3/uL (ref 150–400)
RBC: 4.64 MIL/uL (ref 4.22–5.81)
RDW: 13.9 % (ref 11.5–15.5)
WBC: 12.1 10*3/uL — AB (ref 4.0–10.5)

## 2015-04-08 NOTE — Progress Notes (Signed)
Patient ID: Jonathan Buckley, male   DOB: [birth year], 39 y.o.   MRN: [id number]    Subjective: Feels about the same. Mild left lower quadrant pain and feels he needs to have a bowel movement. Some nausea relieved by medications without vomiting. He would like to try some liquids.  Objective: Vital signs in last 24 hours: Temp:  [97.4 F (36.3 C)-98.6 F (37 C)] 98.5 F (36.9 C) (05/29 0621) Pulse Rate:  [83-109] 109 (05/29 0621) Resp:  [18-20] 20 (05/29 0621) BP: (106-119)/(72-78) 119/78 mmHg (05/29 0621) SpO2:  [95 %-98 %] 95 % (05/29 0621) Last BM Date: 04/06/15  Intake/Output from previous day:   Intake/Output this shift:    General appearance: alert, cooperative and no distress GI: mild low midline abdominal tenderness without guarding or mass or peritoneal signs  Lab Results:   Recent Labs  04/06/15 2228 04/08/15 0511  WBC 13.4* 12.1*  HGB 15.0 13.6  HCT 44.2 40.0  PLT 261 247   BMET  Recent Labs  04/06/15 2228  NA 133*  K 3.9  CL 98*  CO2 23  GLUCOSE 125*  BUN 10  CREATININE 0.77  CALCIUM 9.2     Studies/Results: Ct Abdomen Pelvis W Contrast  04/07/2015   CLINICAL DATA:  Periumbilical pain, onset 3 days ago.  EXAM: CT ABDOMEN AND PELVIS WITH CONTRAST  TECHNIQUE: Multidetector CT imaging of the abdomen and pelvis was performed using the standard protocol following bolus administration of intravenous contrast.  CONTRAST:  100mL OMNIPAQUE IOHEXOL 300 MG/ML  SOLN  COMPARISON:  None.  FINDINGS: BODY WALL: No contributory findings.  LOWER CHEST: No contributory findings.  ABDOMEN/PELVIS:  Liver: No focal abnormality.  Biliary: No evidence of biliary obstruction or stone.  Pancreas: Unremarkable.  Spleen: Unremarkable.  Adrenals: Unremarkable.  Kidneys and ureters: 3 left renal calculi, up to 3 mm. No hydronephrosis or ureteral calculus  Bladder: Unremarkable.  Reproductive: No pathologic findings.  Bowel: There is thickening of the sigmoid colon where there are inflamed  diverticula and extraluminal gas measuring 2 cm in the interloop peritoneal compartment. The surrounding fat is inflamed and the regional small bowel loops are dilated with fecalized contents. There is mild small bowel wall thickening at the level of the gas, compatible with a focal reactive enteritis. Primary small bowel perforation is considered less likely given the overall appearance. Additionally, no foreign body is seen. No remote pneumoperitoneum. Small ascites without loculation. No appendicitis.  Vascular: No acute abnormality.  OSSEOUS: No acute abnormalities.  These results were called by telephone at the time of interpretation on 04/07/2015 at 12:02 am to Dr. Gwyneth SproutWHITNEY PLUNKETT , who verbally acknowledged these results.  IMPRESSION: 1. Sigmoid diverticulitis with contained gaseous perforation. There is reactive enteritis and focal ileus. 2. Left nephrolithiasis.   Electronically Signed   By: Marnee SpringJonathon  Watts M.D.   On: 04/07/2015 00:03    Anti-infectives: Anti-infectives    Start     Dose/Rate Route Frequency Ordered Stop   04/07/15 0600  piperacillin-tazobactam (ZOSYN) IVPB 3.375 g     3.375 g 12.5 mL/hr over 240 Minutes Intravenous 3 times per day 04/07/15 0421     04/07/15 0015  piperacillin-tazobactam (ZOSYN) IVPB 3.375 g     3.375 g 12.5 mL/hr over 240 Minutes Intravenous  Once 04/07/15 0006 04/07/15 0157      Assessment/Plan: Sigmoid diverticulitis with localized perforation. Patient stable and white count decreasing. Continue IV antibiotics. Clear liquid diet.    LOS: 1 day    Malin Sambrano T  04/08/2015  

## 2015-04-09 LAB — CBC
HCT: 40 % (ref 39.0–52.0)
Hemoglobin: 13.4 g/dL (ref 13.0–17.0)
MCH: 28.9 pg (ref 26.0–34.0)
MCHC: 33.5 g/dL (ref 30.0–36.0)
MCV: 86.4 fL (ref 78.0–100.0)
Platelets: 245 10*3/uL (ref 150–400)
RBC: 4.63 MIL/uL (ref 4.22–5.81)
RDW: 14 % (ref 11.5–15.5)
WBC: 11.1 10*3/uL — AB (ref 4.0–10.5)

## 2015-04-09 MED ORDER — ACETAMINOPHEN 500 MG PO TABS
1000.0000 mg | ORAL_TABLET | Freq: Once | ORAL | Status: AC
  Administered 2015-04-09: 1000 mg via ORAL
  Filled 2015-04-09: qty 2

## 2015-04-09 MED ORDER — ACETAMINOPHEN 325 MG PO TABS
650.0000 mg | ORAL_TABLET | ORAL | Status: DC | PRN
Start: 2015-04-09 — End: 2015-04-17
  Administered 2015-04-10: 650 mg via ORAL
  Filled 2015-04-09: qty 2

## 2015-04-09 NOTE — Progress Notes (Signed)
Patient ID: Jonathan Buckley, male   DOB: 25-Apr-1976, 39 y.o.   MRN: [id number]    Subjective: Feels better. Mild left lower quadrant pain when ambulating. No nausea, tolerating clear liquids.  Objective: Vital signs in last 24 hours: Temp:  [98.2 F (36.8 C)-98.4 F (36.9 C)] 98.2 F (36.8 C) (05/30 0501) Pulse Rate:  [90-102] 90 (05/30 0501) Resp:  [16-18] 17 (05/30 0501) BP: (121-133)/(84-89) 121/89 mmHg (05/30 0501) SpO2:  [98 %-100 %] 100 % (05/30 0501) Weight:  [92.56 kg (204 lb 0.9 oz)] 92.56 kg (204 lb 0.9 oz) (05/30 0501) Last BM Date: 04/06/15  Intake/Output from previous day: 05/29 0701 - 05/30 0700 In: 600 [P.O.:600] Out: -  Intake/Output this shift:    General appearance: alert, cooperative and no distress GI: mild low midline abdominal tenderness without guarding or mass or peritoneal signs  Lab Results:   Recent Labs  04/08/15 0511 04/09/15 0459  WBC 12.1* 11.1*  HGB 13.6 13.4  HCT 40.0 40.0  PLT 247 245   BMET  Recent Labs  04/06/15 2228  NA 133*  K 3.9  CL 98*  CO2 23  GLUCOSE 125*  BUN 10  CREATININE 0.77  CALCIUM 9.2     Studies/Results: No results found.  Anti-infectives: Anti-infectives    Start     Dose/Rate Route Frequency Ordered Stop   04/07/15 0600  piperacillin-tazobactam (ZOSYN) IVPB 3.375 g     3.375 g 12.5 mL/hr over 240 Minutes Intravenous 3 times per day 04/07/15 0421     04/07/15 0015  piperacillin-tazobactam (ZOSYN) IVPB 3.375 g     3.375 g 12.5 mL/hr over 240 Minutes Intravenous  Once 04/07/15 0006 04/07/15 0157      Assessment/Plan: Sigmoid diverticulitis with localized perforation. Patient stable and white count decreasing. Continue IV antibiotics. Full liquid diet.  Await return of bowel function.    LOS: 2 days    Candiace West C. 04/09/2015

## 2015-04-10 ENCOUNTER — Encounter (HOSPITAL_COMMUNITY): Payer: Self-pay | Admitting: Surgery

## 2015-04-10 ENCOUNTER — Inpatient Hospital Stay (HOSPITAL_COMMUNITY): Payer: BLUE CROSS/BLUE SHIELD

## 2015-04-10 ENCOUNTER — Ambulatory Visit (HOSPITAL_COMMUNITY): Payer: BLUE CROSS/BLUE SHIELD

## 2015-04-10 LAB — CBC
HEMATOCRIT: 42.5 % (ref 39.0–52.0)
Hemoglobin: 14.2 g/dL (ref 13.0–17.0)
MCH: 29.1 pg (ref 26.0–34.0)
MCHC: 33.4 g/dL (ref 30.0–36.0)
MCV: 87.1 fL (ref 78.0–100.0)
Platelets: 280 10*3/uL (ref 150–400)
RBC: 4.88 MIL/uL (ref 4.22–5.81)
RDW: 14.3 % (ref 11.5–15.5)
WBC: 14.2 10*3/uL — ABNORMAL HIGH (ref 4.0–10.5)

## 2015-04-10 LAB — BASIC METABOLIC PANEL
Anion gap: 11 (ref 5–15)
BUN: 8 mg/dL (ref 6–20)
CALCIUM: 9.3 mg/dL (ref 8.9–10.3)
CO2: 29 mmol/L (ref 22–32)
Chloride: 96 mmol/L — ABNORMAL LOW (ref 101–111)
Creatinine, Ser: 0.92 mg/dL (ref 0.61–1.24)
GFR calc Af Amer: >60 mL/min (ref >60)
GFR calc non Af Amer: >60 mL/min (ref >60)
Glucose, Bld: 130 mg/dL — ABNORMAL HIGH (ref 65–99)
POTASSIUM: 3.8 mmol/L (ref 3.5–5.1)
Sodium: 136 mmol/L (ref 135–145)

## 2015-04-10 LAB — PROTIME-INR
INR: 1.25 (ref 0.00–1.49)
Prothrombin Time: 15.8 seconds — ABNORMAL HIGH (ref 11.6–15.2)

## 2015-04-10 MED ORDER — MAGIC MOUTHWASH
15.0000 mL | Freq: Four times a day (QID) | ORAL | Status: DC | PRN
  Filled 2015-04-10: qty 15

## 2015-04-10 MED ORDER — ALUM & MAG HYDROXIDE-SIMETH 200-200-20 MG/5ML PO SUSP: 30.0000 mL | Freq: Four times a day (QID) | ORAL | Status: DC | PRN

## 2015-04-10 MED ORDER — PROMETHAZINE HCL 25 MG/ML IJ SOLN: 6.2500 mg | INTRAMUSCULAR | Status: DC | PRN

## 2015-04-10 MED ORDER — METOPROLOL TARTRATE 1 MG/ML IV SOLN: 5.0000 mg | Freq: Four times a day (QID) | INTRAVENOUS | Status: DC | PRN

## 2015-04-10 MED ORDER — PHENOL 1.4 % MT LIQD
2.0000 | OROMUCOSAL | Status: DC | PRN
  Filled 2015-04-10: qty 177

## 2015-04-10 MED ORDER — SACCHAROMYCES BOULARDII 250 MG PO CAPS
250.0000 mg | ORAL_CAPSULE | Freq: Two times a day (BID) | ORAL | Status: DC
  Administered 2015-04-10 – 2015-04-17 (×15): 250 mg via ORAL
  Filled 2015-04-10 (×16): qty 1

## 2015-04-10 MED ORDER — FENTANYL CITRATE (PF) 100 MCG/2ML IJ SOLN
INTRAMUSCULAR | Status: AC
  Filled 2015-04-10: qty 4

## 2015-04-10 MED ORDER — LACTATED RINGERS IV BOLUS (SEPSIS): 1000.0000 mL | Freq: Three times a day (TID) | INTRAVENOUS | Status: AC | PRN

## 2015-04-10 MED ORDER — ALPRAZOLAM 0.5 MG PO TABS
0.5000 mg | ORAL_TABLET | Freq: Every day | ORAL | Status: DC
  Administered 2015-04-10 – 2015-04-17 (×8): 0.5 mg via ORAL
  Filled 2015-04-10 (×8): qty 1

## 2015-04-10 MED ORDER — FENTANYL CITRATE (PF) 100 MCG/2ML IJ SOLN
INTRAMUSCULAR | Status: AC | PRN
  Administered 2015-04-10: 25 ug via INTRAVENOUS
  Administered 2015-04-10: 50 ug via INTRAVENOUS

## 2015-04-10 MED ORDER — HYDROCODONE-ACETAMINOPHEN 5-325 MG PO TABS: 1.0000 | ORAL_TABLET | ORAL | Status: DC | PRN

## 2015-04-10 MED ORDER — IOHEXOL 300 MG/ML  SOLN
25.0000 mL | INTRAMUSCULAR | Status: AC
  Administered 2015-04-10: 25 mL via ORAL

## 2015-04-10 MED ORDER — IOHEXOL 300 MG/ML  SOLN
100.0000 mL | Freq: Once | INTRAMUSCULAR | Status: AC | PRN
  Administered 2015-04-10: 100 mL via INTRAVENOUS

## 2015-04-10 MED ORDER — KETOROLAC TROMETHAMINE 15 MG/ML IJ SOLN
15.0000 mg | Freq: Four times a day (QID) | INTRAMUSCULAR | Status: AC | PRN
  Administered 2015-04-10 – 2015-04-14 (×2): 15 mg via INTRAVENOUS
  Filled 2015-04-10 (×2): qty 1

## 2015-04-10 MED ORDER — DIPHENHYDRAMINE HCL 50 MG/ML IJ SOLN: 12.5000 mg | Freq: Four times a day (QID) | INTRAMUSCULAR | Status: DC | PRN

## 2015-04-10 MED ORDER — FLUCONAZOLE IN SODIUM CHLORIDE 400-0.9 MG/200ML-% IV SOLN
400.0000 mg | INTRAVENOUS | Status: DC
  Administered 2015-04-10 – 2015-04-16 (×7): 400 mg via INTRAVENOUS
  Filled 2015-04-10 (×8): qty 200

## 2015-04-10 MED ORDER — MIDAZOLAM HCL 2 MG/2ML IJ SOLN
INTRAMUSCULAR | Status: AC
Start: 2015-04-10 — End: 2015-04-10
  Filled 2015-04-10: qty 4

## 2015-04-10 MED ORDER — MIDAZOLAM HCL 2 MG/2ML IJ SOLN
INTRAMUSCULAR | Status: AC | PRN
  Administered 2015-04-10: 1 mg via INTRAVENOUS
  Administered 2015-04-10: 0.5 mg via INTRAVENOUS

## 2015-04-10 MED ORDER — MENTHOL 3 MG MT LOZG
1.0000 | LOZENGE | OROMUCOSAL | Status: DC | PRN
  Filled 2015-04-10: qty 9

## 2015-04-10 MED ORDER — LIP MEDEX EX OINT
1.0000 "application " | TOPICAL_OINTMENT | Freq: Two times a day (BID) | CUTANEOUS | Status: DC
  Administered 2015-04-10 – 2015-04-17 (×14): 1 via TOPICAL
  Filled 2015-04-10 (×4): qty 7

## 2015-04-10 NOTE — Progress Notes (Signed)
Note: Portions of this report may have been transcribed using voice recognition software. Every effort was made to ensure accuracy; however, inadvertent computerized transcription errors may be present.   Any transcriptional errors that result from this process are unintentional.            Jonathan Buckley  07/20/1976 [id number]  Patient Care Team: No Pcp Per Patient as PCP - General (General Practice)  CT scan with increased gas pocket At site of sigmoid diverticular perforation.  Also some pockets of free air.  Patient not going into clinical shock.  Discussed with Jonathan Buckley.  See if IR can perform percutaneous aspiration of the largest gas pocket with placement of drain to try and control the perforation better.  If this fails, laparoscopic versus open abdominal exploration with washout and probable Hartmann resection.  Because the patient is not toxic and wishes to avoid surgery, we will try this first if possible.  Looks like there is a decent window for drain placement  Patient Active Problem List   Diagnosis Date Noted  . Diverticulitis of sigmoid colon with perforation 04/07/2015    Past Medical History  Diagnosis Date  . Back ache   . Diverticulitis of sigmoid colon with perforation 04/07/2015    History reviewed. No pertinent past surgical history.  History   Social History  . Marital Status: Married    Spouse Name: N/A  . Number of Children: N/A  . Years of Education: N/A   Occupational History  . Not on file.   Social History Main Topics  . Smoking status: Never Smoker   . Smokeless tobacco: Not on file  . Alcohol Use: No  . Drug Use: No  . Sexual Activity: Not on file   Other Topics Concern  . Not on file   Social History Narrative    History reviewed. No pertinent family history.  Current Facility-Administered Medications  Medication Dose Route Frequency Provider Last Rate Last Dose  . acetaminophen (TYLENOL) tablet 650 mg  650 mg Oral Q4H PRN  Romie Levee, MD      . ALPRAZolam Prudy Feeler) tablet 0.5 mg  0.5 mg Oral Daily Jonathan Soda, MD      . alum & mag hydroxide-simeth (MAALOX/MYLANTA) 200-200-20 MG/5ML suspension 30 mL  30 mL Oral Q6H PRN Jonathan Soda, MD      . dextrose 5 % and 0.9 % NaCl with KCl 20 mEq/L infusion   Intravenous Continuous Jonathan Soda, MD 100 mL/hr at 04/10/15 1005    . diphenhydrAMINE (BENADRYL) injection 12.5-25 mg  12.5-25 mg Intravenous Q6H PRN Jonathan Soda, MD      . fluconazole (DIFLUCAN) IVPB 400 mg  400 mg Intravenous Q24H Jonathan Soda, MD      . ketorolac (TORADOL) 15 MG/ML injection 15-30 mg  15-30 mg Intravenous Q6H PRN Jonathan Soda, MD      . lactated ringers bolus 1,000 mL  1,000 mL Intravenous Q8H PRN Jonathan Soda, MD      . lip balm (CARMEX) ointment 1 application  1 application Topical BID Jonathan Soda, MD      . magic mouthwash  15 mL Oral QID PRN Jonathan Soda, MD      . menthol-cetylpyridinium (CEPACOL) lozenge 3 mg  1 lozenge Oral PRN Jonathan Soda, MD      . metoprolol (LOPRESSOR) injection 5 mg  5 mg Intravenous Q6H PRN Jonathan Soda, MD      . morphine 2 MG/ML injection 2-6 mg  2-6 mg Intravenous Q2H  PRN Jonathan FellowsBenjamin Hoxworth, MD   4 mg at 04/10/15 1005  . ondansetron (ZOFRAN) injection 4 mg  4 mg Intravenous Q6H PRN Jonathan FellowsBenjamin Hoxworth, MD   4 mg at 04/10/15 0537  . phenol (CHLORASEPTIC) mouth spray 2 spray  2 spray Mouth/Throat PRN Jonathan SodaSteven Quitman Norberto, MD      . piperacillin-tazobactam (ZOSYN) IVPB 3.375 g  3.375 g Intravenous 3 times per day Jonathan FellowsBenjamin Hoxworth, MD   3.375 g at 04/10/15 0537  . promethazine (PHENERGAN) injection 6.25-12.5 mg  6.25-12.5 mg Intravenous Q4H PRN Jonathan SodaSteven Jersie Beel, MD      . saccharomyces boulardii (FLORASTOR) capsule 250 mg  250 mg Oral BID Jonathan SodaSteven Rogers Ditter, MD   250 mg at 04/10/15 1027     No Known Allergies  BP 117/87 mmHg  Pulse 113  Temp(Src) 97.8 F (36.6 C) (Oral)  Resp 20  Ht 5\' 11"  (1.803 m)  Wt 92.56 kg (204 lb 0.9 oz)  BMI 28.47 kg/m2  SpO2 97%  Ct Abdomen Pelvis W  Contrast  04/10/2015   CLINICAL DATA:  Diverticulitis  EXAM: CT ABDOMEN AND PELVIS WITH CONTRAST  TECHNIQUE: Multidetector CT imaging of the abdomen and pelvis was performed using the standard protocol following bolus administration of intravenous contrast.  CONTRAST:  100mL OMNIPAQUE IOHEXOL 300 MG/ML  SOLN  COMPARISON:  None.  FINDINGS: Findings related to sigmoid diverticulitis with perforation are present in the left lower quadrant. There is wall thickening of the sigmoid colon associated with stranding and in [name] fat. There is a collection of extraluminal bowel gas adjacent to the inflamed sigmoid colon measuring 4.9 x 2.6 cm on image 83. There is associated fluid layering in the pelvis with a subtle enhancing rind of tissue. This secondary fluid collection measures 1.9 x 2.9 cm. There is scattered free intraperitoneal gas again compatible with perforation. This extends above the liver and towards the pelvis. There is also scattered free-fluid in the mesentery.  There are dilated small bowel loops with a transition zone near the inflammatory process consistent with partial small bowel obstruction. Enteric contrast is present in the colon.  Mild periportal edema  Gallbladder, spleen, pancreas, adrenal glands are within normal limits  Right kidney is unremarkable. Tiny calculi in the left renal upper pole.  Normal appendix.  IMPRESSION: Findings are consistent with sigmoid diverticulitis and perforation. There is a large collection of extraluminal bowel gas adjacent to the sigmoid colon compatible with perforation. There is also a small layering fluid collection in the dependent portion of the pelvis which may represent infected fluid. There is also free intraperitoneal gas.  Partial small bowel obstruction secondary to the above inflammatory process.  Critical Value/emergent results were called by telephone at the time of interpretation on 04/10/2015 at 12:09 pm to Dr. Karie SodaSTEVEN Angelik Buckley , who verbally acknowledged  these results.   Electronically Signed   By: Jonathan ClickArthur  Buckley M.D.   On: 04/10/2015 12:09   Ct Abdomen Pelvis W Contrast  04/07/2015   CLINICAL DATA:  Periumbilical pain, onset 3 days ago.  EXAM: CT ABDOMEN AND PELVIS WITH CONTRAST  TECHNIQUE: Multidetector CT imaging of the abdomen and pelvis was performed using the standard protocol following bolus administration of intravenous contrast.  CONTRAST:  100mL OMNIPAQUE IOHEXOL 300 MG/ML  SOLN  COMPARISON:  None.  FINDINGS: BODY WALL: No contributory findings.  LOWER CHEST: No contributory findings.  ABDOMEN/PELVIS:  Liver: No focal abnormality.  Biliary: No evidence of biliary obstruction or stone.  Pancreas: Unremarkable.  Spleen: Unremarkable.  Adrenals: Unremarkable.  Kidneys and ureters: 3 left renal calculi, up to 3 mm. No hydronephrosis or ureteral calculus  Bladder: Unremarkable.  Reproductive: No pathologic findings.  Bowel: There is thickening of the sigmoid colon where there are inflamed diverticula and extraluminal gas measuring 2 cm in the interloop peritoneal compartment. The surrounding fat is inflamed and the regional small bowel loops are dilated with fecalized contents. There is mild small bowel wall thickening at the level of the gas, compatible with a focal reactive enteritis. Primary small bowel perforation is considered less likely given the overall appearance. Additionally, no foreign body is seen. No remote pneumoperitoneum. Small ascites without loculation. No appendicitis.  Vascular: No acute abnormality.  OSSEOUS: No acute abnormalities.  These results were called by telephone at the time of interpretation on 04/07/2015 at 12:02 am to Dr. Gwyneth Sprout , who verbally acknowledged these results.  IMPRESSION: 1. Sigmoid diverticulitis with contained gaseous perforation. There is reactive enteritis and focal ileus. 2. Left nephrolithiasis.   Electronically Signed   By: Marnee Spring M.D.   On: 04/07/2015 00:03

## 2015-04-10 NOTE — Progress Notes (Signed)
Pt. Spiked temp last night 103.3 pulse was 149. Dr Maisie Fushomas made aware ordered tylenol 1000 mg and ice packs. Temp after med 100.4 temp this am down to 97.8 pt. In no distress will continue to monitor.

## 2015-04-10 NOTE — Consult Note (Signed)
Reason for consult: Pelvic abscess drainage  Referring Physician(s): [facility]  History of Present Illness: Jonathan Buckley is a 39 y.o. male with recent history of nausea/vomiting/diarrhea, fever, chills and onset of abdominal pain few days ago which has persisted. Subsequent CT scan on 04/07/15 revealed sigmoid diverticulitis with contained gaseous perforation as well as reactive enteritis and focal ileus. Patient has continued to have some intermittent fevers as well as elevated white blood cell count currently of 14.2. Follow-up CT scan today has revealed a large collection of extraluminal bowel gas adjacent to the sigmoid colon compatible with perforation. Is also small layering fluid collection in the dependent portion of the pelvis as well as free intraperitoneal gas and partial small bowel obstruction. Request has now been received from [facility] for drainage of the largest left pelvic gas pocket.  Past Medical History  Diagnosis Date  . Back ache   . Diverticulitis of sigmoid colon with perforation 04/07/2015    History reviewed. No pertinent past surgical history.  Allergies: Review of patient's allergies indicates no known allergies.  Medications: Prior to Admission medications   Medication Sig Start Date End Date Taking? Authorizing Provider  ALPRAZolam Prudy Feeler) 0.5 MG tablet Take 0.5 mg by mouth daily.   Yes Historical Provider, MD  ibuprofen (ADVIL,MOTRIN) 200 MG tablet Take 400 mg by mouth every 6 (six) hours as needed for moderate pain.   Yes Historical Provider, MD  tiZANidine (ZANAFLEX) 2 MG tablet Take 2 mg by mouth every 6 (six) hours as needed for muscle spasms.   Yes Historical Provider, MD     History reviewed. No pertinent family history.  History   Social History  . Marital Status: Married    Spouse Name: N/A  . Number of Children: N/A  . Years of Education: N/A   Social History Main Topics  . Smoking status: Never Smoker   . Smokeless tobacco: Not on file  .  Alcohol Use: No  . Drug Use: No  . Sexual Activity: Not on file   Other Topics Concern  . None   Social History Narrative      Review of Systems see above; current abdominal pain is primarily in the left lower quadrant, left upper and right upper quadrant regions.  Vital Signs: BP 117/87 mmHg  Pulse 113  Temp(Src) 98.2 F (36.8 C) (Oral)  Resp 20  Ht  (1.803 m)  Wt 204 lb 0.9 oz (92.56 kg)  BMI 28.47 kg/m2  SpO2 97%  Physical Exam patient awake, alert and oriented. Chest is clear to auscultation bilaterally. Heart tachycardic but regular. Abdomen soft, mildly distended, few bowel sounds, mild to moderately tender right upper/left upper and left lower quadrant regions. Extremities with full range of motion and no edema.  Mallampati Score:     Imaging: Ct Abdomen Pelvis W Contrast  04/10/2015   CLINICAL DATA:  Diverticulitis  EXAM: CT ABDOMEN AND PELVIS WITH CONTRAST  TECHNIQUE: Multidetector CT imaging of the abdomen and pelvis was performed using the standard protocol following bolus administration of intravenous contrast.  CONTRAST:  OMNIPAQUE IOHEXOL 300 MG/ML  SOLN  COMPARISON:  None.  FINDINGS: Findings related to sigmoid diverticulitis with perforation are present in the left lower quadrant. There is wall thickening of the sigmoid colon associated with stranding and in [place] fat. There is a collection of extraluminal bowel gas adjacent to the inflamed sigmoid colon measuring 4.9 x 2.6 cm on image 83. There is associated fluid layering in the pelvis  with a subtle enhancing rind of tissue. This secondary fluid collection measures 1.9 x 2.9 cm. There is scattered free intraperitoneal gas again compatible with perforation. This extends above the liver and towards the pelvis. There is also scattered free-fluid in the mesentery.  There are dilated small bowel loops with a transition zone near the inflammatory process consistent with partial small bowel obstruction. Enteric  contrast is present in the colon.  Mild periportal edema  Gallbladder, spleen, pancreas, adrenal glands are within normal limits  Right kidney is unremarkable. Tiny calculi in the left renal upper pole.  Normal appendix.  IMPRESSION: Findings are consistent with sigmoid diverticulitis and perforation. There is a large collection of extraluminal bowel gas adjacent to the sigmoid colon compatible with perforation. There is also a small layering fluid collection in the dependent portion of the pelvis which may represent infected fluid. There is also free intraperitoneal gas.  Partial small bowel obstruction secondary to the above inflammatory process.  Critical Value/emergent results were called by telephone at the time of interpretation on 04/10/2015 at 12:09 pm to Dr. Karie SodaSTEVEN GROSS , who verbally acknowledged these results.   Electronically Signed   By: Jolaine ClickArthur  Hoss M.D.   On: 04/10/2015 12:09   Ct Abdomen Pelvis W Contrast  04/07/2015   CLINICAL DATA:  Periumbilical pain, onset 3 days ago.  EXAM: CT ABDOMEN AND PELVIS WITH CONTRAST  TECHNIQUE: Multidetector CT imaging of the abdomen and pelvis was performed using the standard protocol following bolus administration of intravenous contrast.  CONTRAST:  100mL OMNIPAQUE IOHEXOL 300 MG/ML  SOLN  COMPARISON:  None.  FINDINGS: BODY WALL: No contributory findings.  LOWER CHEST: No contributory findings.  ABDOMEN/PELVIS:  Liver: No focal abnormality.  Biliary: No evidence of biliary obstruction or stone.  Pancreas: Unremarkable.  Spleen: Unremarkable.  Adrenals: Unremarkable.  Kidneys and ureters: 3 left renal calculi, up to 3 mm. No hydronephrosis or ureteral calculus  Bladder: Unremarkable.  Reproductive: No pathologic findings.  Bowel: There is thickening of the sigmoid colon where there are inflamed diverticula and extraluminal gas measuring 2 cm in the interloop peritoneal compartment. The surrounding fat is inflamed and the regional small bowel loops are dilated with  fecalized contents. There is mild small bowel wall thickening at the level of the gas, compatible with a focal reactive enteritis. Primary small bowel perforation is considered less likely given the overall appearance. Additionally, no foreign body is seen. No remote pneumoperitoneum. Small ascites without loculation. No appendicitis.  Vascular: No acute abnormality.  OSSEOUS: No acute abnormalities.  These results were called by telephone at the time of interpretation on 04/07/2015 at 12:02 am to Dr. Gwyneth SproutWHITNEY PLUNKETT , who verbally acknowledged these results.  IMPRESSION: 1. Sigmoid diverticulitis with contained gaseous perforation. There is reactive enteritis and focal ileus. 2. Left nephrolithiasis.   Electronically Signed   By: Marnee SpringJonathon  Watts M.D.   On: 04/07/2015 00:03    Labs:  CBC:  Recent Labs  04/06/15 2228 04/08/15 0511 04/09/15 0459 04/10/15 0940  WBC 13.4* 12.1* 11.1* 14.2*  HGB 15.0 13.6 13.4 14.2  HCT 44.2 40.0 40.0 42.5  PLT 261 247 245 280    COAGS: No results for input(s): INR, APTT in the last 8760 hours.  BMP:  Recent Labs  04/06/15 2228 04/10/15 0940  NA 133* 136  K 3.9 3.8  CL 98* 96*  CO2 23 29  GLUCOSE 125* 130*  BUN 10 8  CALCIUM 9.2 9.3  CREATININE 0.77 0.92  GFRNONAA >60 >60  GFRAA >60 >60    LIVER FUNCTION TESTS:  Recent Labs  04/06/15 2228  BILITOT 0.7  AST 19  ALT 23  ALKPHOS 67  PROT 8.8*  ALBUMIN 3.8    TUMOR MARKERS: No results for input(s): AFPTM, CEA, CA199, CHROMGRNA in the last 8760 hours.  Assessment and Plan: Patient with history of nausea, vomiting, fever, chills, abdominal pain, leukocytosis and evidence of perforated sigmoid diverticulitis/free intraperitoneal gas on recent CT scan. Request has now been received for CT guided drainage of the largest left pelvic air/fluid pocket. Imaging studies have been reviewed by Dr. Deanne Coffer and area appears to be amenable to aspiration /possible drainage. Details/risks of procedure,  including but not limited to, internal bleeding, sepsis, need for emergency surgery, injury to adjacent organs, and death discussed with patient with his understanding and consent. Procedure planned for later today.  Signed: D. Jeananne Rama 04/10/2015, 1:27 PM   I spent a total of 20 minutes in face to face in clinical consultation, greater than 50% of which was counseling/coordinating care for left pelvic abscess/air-fluid collection drainage.

## 2015-04-10 NOTE — Procedures (Signed)
CT placement 4658f drain into L pelvic collection No complication No blood loss. [facility].

## 2015-04-10 NOTE — Progress Notes (Signed)
[facility]  [address]  [place], [place] [id number] Phone: [phone] FAX: [phone]   Jonathan Jocksif Holmes [id number] [birth year]   Problem List:   Active Problems:   Diverticulitis   Diverticulitis large intestine        Assessment  Fever spike with diverticulitis - better now but not WNL  Plan:  CT scan r/o abscess / improvement.  If abscess found, consider percutaneous drainage if appropriate window could be found.  Hopefully left suprapubic anterior abdominal approach.  Reduced to sips only for now until CAT scan done.  If CAT scan shows marked improvement, gradually advance diet and see if we can transition to outpatient antibodies in the next few days.  Continue IV antibodies.  Continue Zosyn for now.  Follow up on blood work.  Maybe he had anti-fungal coverage if white count increasing.  I do agree with Dr. Maisie Fushomas that at some point he would benefit from segmental colonic resection to prevent further attacks since he is had a complicated attack at age only [age].  Ideally would like to wait for this attack to resolve, obtained colonoscopy and then colectomy.  Do that 6-8 weeks from now.  If he markedly deteriorates, abdominal exploration with washout versus Hartman resection.  I spent half hour talking to the patient and his wife at length.  Concerns addressed.  Pathophysiology discussed.  Questions answered.  They expressed understanding and appreciation.  Discussed with the patient's nurse as well.  See if they can help troubleshoot his concerns last night.  -VTE prophylaxis- SCDs, etc  -mobilize as tolerated to help recovery  Ardeth SportsmanSteven C. Jaiya Mooradian, M.D., F.A.C.S. Gastrointestinal and Minimally Invasive Surgery [facility] [address] [place], [place] [address] [phone] Main / Paging   04/10/2015  Subjective:  Fever spike last night.  IV infiltrated.  Struggled for a few hours to  get IV working correctly.  Very frustrated about nursing care last night  Patient feels much better now.  Hungry.  Having flatus.  No bowel movements.  Objective:  Vital signs:  Filed Vitals:   04/09/15 1332 04/09/15 2149 04/09/15 2316 04/10/15 0536  BP: 133/80 112/71  117/87  Pulse: 109 150  113  Temp: 98.1 F (36.7 C) 103.3 F (39.6 C) 100.4 F (38 C) 97.8 F (36.6 C)  TempSrc: Oral Oral Oral Oral  Resp: 18 18  20   Height:      Weight:      SpO2: 98% 97%  97%    Last BM Date: 04/06/15  Intake/Output   Yesterday:  05/30 0701 - 05/31 0700 In: 1123 [P.O.:480; I.V.:593; IV Piggyback:50] Out: -  This shift:     Bowel function:  Flatus: y  BM: n  Drain: n/a  Physical Exam:  General: Pt awake/alert/oriented x4 in no acute distress.  Calm.  Talkative.  Nontoxic.  Not sickly. Eyes: PERRL, normal EOM.  Sclera clear.  No icterus Neuro: CN II-XII intact w/o focal sensory/motor deficits. Lymph: No head/neck/groin lymphadenopathy Psych:  No delerium/psychosis/paranoia HENT: Normocephalic, Mucus membranes moist.  No thrush Neck: Supple, No tracheal deviation Chest: No chest wall pain w good excursion CV:  Pulses intact.  Regular rhythm MS: Normal AROM mjr joints.  No obvious deformity Abdomen: Soft.  Overweight.  Mildly distended.  Mildly tender at Left suprapubic.  No evidence of peritonitis.  No incarcerated hernias. Ext:  SCDs BLE.  No mjr edema.  No cyanosis Skin: No petechiae / purpura  Results:  Labs: Results for orders placed or performed during the hospital encounter of 04/06/15 (from the past 48 hour(s))  CBC     Status: Abnormal   Collection Time: 04/09/15  4:59 AM  Result Value Ref Range   WBC 11.1 (H) 4.0 - 10.5 K/uL   RBC 4.63 4.22 - 5.81 MIL/uL   Hemoglobin 13.4 13.0 - 17.0 g/dL   HCT 40.9 81.1 - 91.4 %   MCV 86.4 78.0 - 100.0 fL   MCH 28.9 26.0 - 34.0 pg   MCHC 33.5 30.0 - 36.0 g/dL   RDW 78.2 95.6 - 21.3 %   Platelets 245 150 - 400 K/uL     Imaging / Studies: No results found.  Medications / Allergies: per chart  Antibiotics: Anti-infectives    Start     Dose/Rate Route Frequency Ordered Stop   04/07/15 0600  piperacillin-tazobactam (ZOSYN) IVPB 3.375 g     3.375 g 12.5 mL/hr over 240 Minutes Intravenous 3 times per day 04/07/15 0421     04/07/15 0015  piperacillin-tazobactam (ZOSYN) IVPB 3.375 g     3.375 g 12.5 mL/hr over 240 Minutes Intravenous  Once 04/07/15 0006 04/07/15 0157        Note: Portions of this report may have been transcribed using voice recognition software. Every effort was made to ensure accuracy; however, inadvertent computerized transcription errors may be present.   Any transcriptional errors that result from this process are unintentional.     Ardeth Sportsman, M.D., F.A.C.S. Gastrointestinal and Minimally Invasive Surgery [facility] [address] [place], [place] [address] [phone] Main / Paging   04/10/2015  CARE TEAM:  PCP: No PCP Per Patient  Outpatient Care Team: Patient Care Team: No Pcp Per Patient as PCP - General (General Practice)  Inpatient Treatment Team: Treatment Team: Attending Provider: Bishop Limbo, MD; Registered Nurse: Rondel Jumbo, RN; Technician: Philis Nettle, NT; Registered Nurse: Maury Dus, RN; Registered Nurse: Lilly Cove, RN; Registered Nurse: Ashley Royalty, RN

## 2015-04-11 ENCOUNTER — Ambulatory Visit: Payer: BLUE CROSS/BLUE SHIELD | Admitting: Rehabilitation

## 2015-04-11 LAB — BASIC METABOLIC PANEL
Anion gap: 8 (ref 5–15)
BUN: 9 mg/dL (ref 6–20)
CO2: 26 mmol/L (ref 22–32)
Calcium: 8.5 mg/dL — ABNORMAL LOW (ref 8.9–10.3)
Chloride: 101 mmol/L (ref 101–111)
Creatinine, Ser: 1.04 mg/dL (ref 0.61–1.24)
GFR calc non Af Amer: >60 mL/min (ref >60)
Glucose, Bld: 126 mg/dL — ABNORMAL HIGH (ref 65–99)
Potassium: 4 mmol/L (ref 3.5–5.1)
SODIUM: 135 mmol/L (ref 135–145)

## 2015-04-11 LAB — CBC
HCT: 35.3 % — ABNORMAL LOW (ref 39.0–52.0)
Hemoglobin: 11.5 g/dL — ABNORMAL LOW (ref 13.0–17.0)
MCH: 27.9 pg (ref 26.0–34.0)
MCHC: 32.6 g/dL (ref 30.0–36.0)
MCV: 85.7 fL (ref 78.0–100.0)
Platelets: 223 10*3/uL (ref 150–400)
RBC: 4.12 MIL/uL — ABNORMAL LOW (ref 4.22–5.81)
RDW: 14.3 % (ref 11.5–15.5)
WBC: 11.3 10*3/uL — ABNORMAL HIGH (ref 4.0–10.5)

## 2015-04-11 MED ORDER — DOCUSATE SODIUM 100 MG PO CAPS
100.0000 mg | ORAL_CAPSULE | Freq: Two times a day (BID) | ORAL | Status: DC
  Administered 2015-04-11: 100 mg via ORAL
  Filled 2015-04-11 (×2): qty 1

## 2015-04-11 NOTE — Progress Notes (Signed)
[facility] Progress Note     Subjective: Pt doing much better, pain is less significant.  He's not having any flatus or BM.  No N/V.  Tolerating ice chips.  He says he feels overall better, but keeps feeling feverish at night.  The drain is in place and draining bloody tan purulent drainage.    Objective: Vital signs in last 24 hours: Temp:  [98.2 F (36.8 C)-100.3 F (37.9 C)] 98.8 F (37.1 C) (06/01 0534) Pulse Rate:  [86-111] 103 (06/01 0534) Resp:  [15-23] 16 (06/01 0534) BP: (100-126)/(66-91) 116/71 mmHg (06/01 0534) SpO2:  [97 %-100 %] 97 % (05/31 2107) Last BM Date: 04/06/15  Intake/Output from previous day: 05/31 0701 - 06/01 0700 In: 2431.7 [P.O.:90; I.V.:1991.7; IV Piggyback:350] Out: 0  Intake/Output this shift:    PE: Gen:  Alert, NAD, pleasant Abd: Soft, mild distension and pain in LLQ and over drain site, few BS, no HSM   Lab Results:   Recent Labs  04/10/15 0940 04/11/15 0508  WBC 14.2* 11.3*  HGB 14.2 11.5*  HCT 42.5 35.3*  PLT 280 223   BMET  Recent Labs  04/10/15 0940 04/11/15 0508  NA 136 135  K 3.8 4.0  CL 96* 101  CO2 29 26  GLUCOSE 130* 126*  BUN 8 9  CREATININE 0.92 1.04  CALCIUM 9.3 8.5*   PT/INR  Recent Labs  04/10/15 1335  LABPROT 15.8*  INR 1.25   CMP     Component Value Date/Time   NA 135 04/11/2015 0508   K 4.0 04/11/2015 0508   CL 101 04/11/2015 0508   CO2 26 04/11/2015 0508   GLUCOSE 126* 04/11/2015 0508   BUN 9 04/11/2015 0508   CREATININE 1.04 04/11/2015 0508   CALCIUM 8.5* 04/11/2015 0508   PROT 8.8* 04/06/2015 2228   ALBUMIN 3.8 04/06/2015 2228   AST 19 04/06/2015 2228   ALT 23 04/06/2015 2228   ALKPHOS 67 04/06/2015 2228   BILITOT 0.7 04/06/2015 2228   GFRNONAA >60 04/11/2015 0508   GFRAA >60 04/11/2015 0508   Lipase     Component Value Date/Time   LIPASE 23 04/06/2015 2228       Studies/Results: Ct Abdomen Pelvis W Contrast  04/10/2015   CLINICAL DATA:  Diverticulitis   EXAM: CT ABDOMEN AND PELVIS WITH CONTRAST  TECHNIQUE: Multidetector CT imaging of the abdomen and pelvis was performed using the standard protocol following bolus administration of intravenous contrast.  CONTRAST:  100mL OMNIPAQUE IOHEXOL 300 MG/ML  SOLN  COMPARISON:  None.  FINDINGS: Findings related to sigmoid diverticulitis with perforation are present in the left lower quadrant. There is wall thickening of the sigmoid colon associated with stranding and in EnsignJason fat. There is a collection of extraluminal bowel gas adjacent to the inflamed sigmoid colon measuring 4.9 x 2.6 cm on image 83. There is associated fluid layering in the pelvis with a subtle enhancing rind of tissue. This secondary fluid collection measures 1.9 x 2.9 cm. There is scattered free intraperitoneal gas again compatible with perforation. This extends above the liver and towards the pelvis. There is also scattered free-fluid in the mesentery.  There are dilated small bowel loops with a transition zone near the inflammatory process consistent with partial small bowel obstruction. Enteric contrast is present in the colon.  Mild periportal edema  Gallbladder, spleen, pancreas, adrenal glands are within normal limits  Right kidney is unremarkable. Tiny calculi in the left renal upper pole.  Normal appendix.  IMPRESSION:  Findings are consistent with sigmoid diverticulitis and perforation. There is a large collection of extraluminal bowel gas adjacent to the sigmoid colon compatible with perforation. There is also a small layering fluid collection in the dependent portion of the pelvis which may represent infected fluid. There is also free intraperitoneal gas.  Partial small bowel obstruction secondary to the above inflammatory process.  Critical Value/emergent results were called by telephone at the time of interpretation on 04/10/2015 at 12:09 pm to Dr. Karie Soda , who verbally acknowledged these results.   Electronically Signed   By: Jolaine Click M.D.   On: 04/10/2015 12:09   Ct Image Guided Drainage By Percutaneous Catheter  04/10/2015   CLINICAL DATA:  Diverticulitis with a loculated gas collection adjacent to the sigmoid colon, free intraperitoneal gas, a small amount of fluid in the cul-de-sac.  EXAM: CT GUIDED PERITONEAL ABSCESS DRAIN CATHETER PLACEMENT  ANESTHESIA/SEDATION: Intravenous Fentanyl and Versed were administered as conscious sedation during continuous cardiorespiratory monitoring by the radiology RN, with a total moderate sedation time of 15 minutes.  PROCEDURE: The procedure risks, benefits, and alternatives were explained to the patient. Questions regarding the procedure were encouraged and answered. The patient understands and consents to the procedure.  Select axial scans through the pelvis were obtained. The for extraluminal gas collection was localized and an appropriate skin entry site was determined and marked.  The operative field was prepped with Betadinein a sterile fashion, and a sterile drape was applied covering the operative field. A sterile gown and sterile gloves were used for the procedure. Local anesthesia was provided with 1% Lidocaine.  Under CT fluoroscopic guidance, an 18 gauge trocar needle was advanced into the collection. An Amplatz guidewire advanced easily. Tract was dilated to facilitate placement of a 12 French pigtail drain catheter, placed in the dependent aspect of the decompressed collection adjacent to the sigmoid colon. No fluid could be aspirated. The catheter was flushed and secured externally with 0 Prolene suture and StatLock, placed to gravity bag. The patient tolerated the procedure well.  COMPLICATIONS: None immediate  FINDINGS: Limited CT again demonstrates a thin extraluminal gas collection adjacent to the distal sigmoid colon. Twelve French percutaneous drain catheter was placed within the collection, which was decompressed post drain placement.  IMPRESSION: 1. Technically successful  CT-guided peritoneal abscess drain catheter placement.   Electronically Signed   By: Corlis Leak M.D.   On: 04/10/2015 17:05    Anti-infectives: Anti-infectives    Start     Dose/Rate Route Frequency Ordered Stop   04/10/15 1300  fluconazole (DIFLUCAN) IVPB 400 mg     400 mg 100 mL/hr over 120 Minutes Intravenous Every 24 hours 04/10/15 1209     04/07/15 0600  piperacillin-tazobactam (ZOSYN) IVPB 3.375 g     3.375 g 12.5 mL/hr over 240 Minutes Intravenous 3 times per day 04/07/15 0421     04/07/15 0015  piperacillin-tazobactam (ZOSYN) IVPB 3.375 g     3.375 g 12.5 mL/hr over 240 Minutes Intravenous  Once 04/07/15 0006 04/07/15 0157       Assessment/Plan Diverticulitis with perforation HD #4 -CT scan (04/10/15) showed diverticulitis with perforation with large collection of extraluminal bowel gas and fluid collection in the pelvis.  PSBO secondary to inflammatory process.  IR placed drain catheter into left pelvic collection on 04/10/15 -NPO for 48 hours and pain must be resolved prior to advancing diet. -Continue IV antibodies. Continue Zosyn for now. Follow up on blood work. Add fluconazole. -I do agree  with Dr. Maisie Fus that at some point he would benefit from segmental colonic resection to prevent further attacks since he is had a complicated attack at age only 43. Ideally would like to wait for this attack to resolve, obtained colonoscopy and then colectomy. Do that 6-8 weeks from now. -If he markedly deteriorates, abdominal exploration with washout versus Hartman resection. -VTE prophylaxis- SCDs, etc -Mobilize as tolerated to help recovery -Obtain cultures from fluid collection since wasn't obtained from IR  Fevers Leukocytosis - 11.3 Tachycardia - mild  Wife at bedside with patient, family is appreciative of our care.    LOS: 4 days    [name] 04/11/2015, 7:48 AM Pager: [phone]

## 2015-04-11 NOTE — Progress Notes (Signed)
Patient ID: Jonathan Buckley, male   DOB: [birth year], 39 y.o.   MRN: [id number]    Referring Physician(s): [facility]  Subjective:  Pt feeling a little better; denies N/V; no BM; can void now without "burning " sensation; has ambulated  Allergies: Review of patient's allergies indicates no known allergies.  Medications: Prior to Admission medications   Medication Sig Start Date End Date Taking? Authorizing Provider  ALPRAZolam Prudy Feeler(XANAX) 0.5 MG tablet Take 0.5 mg by mouth daily.   Yes Historical Provider, MD  ibuprofen (ADVIL,MOTRIN) 200 MG tablet Take 400 mg by mouth every 6 (six) hours as needed for moderate pain.   Yes Historical Provider, MD  tiZANidine (ZANAFLEX) 2 MG tablet Take 2 mg by mouth every 6 (six) hours as needed for muscle spasms.   Yes Historical Provider, MD     Vital Signs: BP 127/78 mmHg  Pulse 100  Temp(Src) 98.2 F (36.8 C) (Oral)  Resp 17  Ht 5\' 11"  (1.803 m)  Wt 204 lb 0.9 oz (92.56 kg)  BMI 28.47 kg/m2  SpO2 98%  Physical Exam awake/alert; mid pelvic drain intact, dressing dry, mildly tender, output minimal- gas and small amt blood tinged fluid  Imaging: Ct Abdomen Pelvis W Contrast  04/10/2015   CLINICAL DATA:  Diverticulitis  EXAM: CT ABDOMEN AND PELVIS WITH CONTRAST  TECHNIQUE: Multidetector CT imaging of the abdomen and pelvis was performed using the standard protocol following bolus administration of intravenous contrast.  CONTRAST:  100mL OMNIPAQUE IOHEXOL 300 MG/ML  SOLN  COMPARISON:  None.  FINDINGS: Findings related to sigmoid diverticulitis with perforation are present in the left lower quadrant. There is wall thickening of the sigmoid colon associated with stranding and in AgesJason fat. There is a collection of extraluminal bowel gas adjacent to the inflamed sigmoid colon measuring 4.9 x 2.6 cm on image 83. There is associated fluid layering in the pelvis with a subtle enhancing rind of tissue. This secondary fluid collection measures 1.9 x 2.9 cm. There is  scattered free intraperitoneal gas again compatible with perforation. This extends above the liver and towards the pelvis. There is also scattered free-fluid in the mesentery.  There are dilated small bowel loops with a transition zone near the inflammatory process consistent with partial small bowel obstruction. Enteric contrast is present in the colon.  Mild periportal edema  Gallbladder, spleen, pancreas, adrenal glands are within normal limits  Right kidney is unremarkable. Tiny calculi in the left renal upper pole.  Normal appendix.  IMPRESSION: Findings are consistent with sigmoid diverticulitis and perforation. There is a large collection of extraluminal bowel gas adjacent to the sigmoid colon compatible with perforation. There is also a small layering fluid collection in the dependent portion of the pelvis which may represent infected fluid. There is also free intraperitoneal gas.  Partial small bowel obstruction secondary to the above inflammatory process.  Critical Value/emergent results were called by telephone at the time of interpretation on 04/10/2015 at 12:09 pm to Dr. Karie SodaSTEVEN GROSS , who verbally acknowledged these results.   Electronically Signed   By: Jolaine ClickArthur  Hoss M.D.   On: 04/10/2015 12:09   Ct Image Guided Drainage By Percutaneous Catheter  04/10/2015   CLINICAL DATA:  Diverticulitis with a loculated gas collection adjacent to the sigmoid colon, free intraperitoneal gas, a small amount of fluid in the cul-de-sac.  EXAM: CT GUIDED PERITONEAL ABSCESS DRAIN CATHETER PLACEMENT  ANESTHESIA/SEDATION: Intravenous Fentanyl and Versed were administered as conscious sedation during continuous cardiorespiratory monitoring by the radiology RN, with  a total moderate sedation time of 15 minutes.  PROCEDURE: The procedure risks, benefits, and alternatives were explained to the patient. Questions regarding the procedure were encouraged and answered. The patient understands and consents to the procedure.   Select axial scans through the pelvis were obtained. The for extraluminal gas collection was localized and an appropriate skin entry site was determined and marked.  The operative field was prepped with Betadinein a sterile fashion, and a sterile drape was applied covering the operative field. A sterile gown and sterile gloves were used for the procedure. Local anesthesia was provided with 1% Lidocaine.  Under CT fluoroscopic guidance, an 18 gauge trocar needle was advanced into the collection. An Amplatz guidewire advanced easily. Tract was dilated to facilitate placement of a 12 French pigtail drain catheter, placed in the dependent aspect of the decompressed collection adjacent to the sigmoid colon. No fluid could be aspirated. The catheter was flushed and secured externally with 0 Prolene suture and StatLock, placed to gravity bag. The patient tolerated the procedure well.  COMPLICATIONS: None immediate  FINDINGS: Limited CT again demonstrates a thin extraluminal gas collection adjacent to the distal sigmoid colon. Twelve French percutaneous drain catheter was placed within the collection, which was decompressed post drain placement.  IMPRESSION: 1. Technically successful CT-guided peritoneal abscess drain catheter placement.   Electronically Signed   By: Corlis Leak M.D.   On: 04/10/2015 17:05    Labs:  CBC:  Recent Labs  04/08/15 0511 04/09/15 0459 04/10/15 0940 04/11/15 0508  WBC 12.1* 11.1* 14.2* 11.3*  HGB 13.6 13.4 14.2 11.5*  HCT 40.0 40.0 42.5 35.3*  PLT 247 245 280 223    COAGS:  Recent Labs  04/10/15 1335  INR 1.25    BMP:  Recent Labs  04/06/15 2228 04/10/15 0940 04/11/15 0508  NA 133* 136 135  K 3.9 3.8 4.0  CL 98* 96* 101  CO2 GLUCOSE 125* 130* 126*  BUN CALCIUM 9.2 9.3 8.5*  CREATININE 0.77 0.92 1.04  GFRNONAA >60 >60 >60  GFRAA >60 >60 >60    LIVER FUNCTION TESTS:  Recent Labs  04/06/15 2228  BILITOT 0.7  AST 19  ALT 23    ALKPHOS 67  PROT 8.8*  ALBUMIN 3.8    Assessment and Plan:  S/p drainage of pelvic air/fluid collection/abscess 5/31; afebrile; WBC down to 11.3, creat ok; check fluid cx's; additional plans as per [facility]  Signed: D. Jeananne Rama 04/11/2015, 2:43 PM   I spent a total of 15 minutes in face to face in clinical consultation/evaluation, greater than 50% of which was counseling/coordinating care for pelvic abscess drain

## 2015-04-12 ENCOUNTER — Ambulatory Visit: Payer: BLUE CROSS/BLUE SHIELD

## 2015-04-12 LAB — BASIC METABOLIC PANEL
ANION GAP: 8 (ref 5–15)
BUN: 6 mg/dL (ref 6–20)
CALCIUM: 8.2 mg/dL — AB (ref 8.9–10.3)
CHLORIDE: 102 mmol/L (ref 101–111)
CO2: 23 mmol/L (ref 22–32)
Creatinine, Ser: 0.97 mg/dL (ref 0.61–1.24)
GFR calc Af Amer: >60 mL/min (ref >60)
Glucose, Bld: 118 mg/dL — ABNORMAL HIGH (ref 65–99)
POTASSIUM: 3.7 mmol/L (ref 3.5–5.1)
Sodium: 133 mmol/L — ABNORMAL LOW (ref 135–145)

## 2015-04-12 LAB — CBC
HCT: 33.9 % — ABNORMAL LOW (ref 39.0–52.0)
Hemoglobin: 11.2 g/dL — ABNORMAL LOW (ref 13.0–17.0)
MCH: 28.6 pg (ref 26.0–34.0)
MCHC: 33 g/dL (ref 30.0–36.0)
MCV: 86.5 fL (ref 78.0–100.0)
Platelets: 267 10*3/uL (ref 150–400)
RBC: 3.92 MIL/uL — ABNORMAL LOW (ref 4.22–5.81)
RDW: 14.7 % (ref 11.5–15.5)
WBC: 9.8 10*3/uL (ref 4.0–10.5)

## 2015-04-12 MED ORDER — DOCUSATE SODIUM 100 MG PO CAPS: 100.0000 mg | ORAL_CAPSULE | Freq: Two times a day (BID) | ORAL | Status: DC | PRN

## 2015-04-12 NOTE — Progress Notes (Signed)
Patient ID: Jonathan Buckley, male   DOB: [birth year], 39 y.o.   MRN: [id number]    Referring Physician(s): CCS  Subjective:  Patient doing fairly well. Still has some tenderness at drain site mid pelvic region; has had several BMs; denies nausea or vomiting  Allergies: Review of patient's allergies indicates no known allergies.  Medications: Prior to Admission medications   Medication Sig Start Date End Date Taking? Authorizing Provider  ALPRAZolam Prudy Feeler) 0.5 MG tablet Take 0.5 mg by mouth daily.   Yes Historical Provider, MD  ibuprofen (ADVIL,MOTRIN) 200 MG tablet Take 400 mg by mouth every 6 (six) hours as needed for moderate pain.   Yes Historical Provider, MD  tiZANidine (ZANAFLEX) 2 MG tablet Take 2 mg by mouth every 6 (six) hours as needed for muscle spasms.   Yes Historical Provider, MD     Vital Signs: BP 98/58 mmHg  Pulse 97  Temp(Src) 99 F (37.2 C) (Oral)  Resp 18  Ht  (1.803 m)  Wt 204 lb 0.9 oz (92.56 kg)  BMI 28.47 kg/m2  SpO2 96%  Physical Exam patient awake, alert. Mid pelvic drain intact, insertion site okay, mildly tender; output minimal amount of blood tinged fluid and air;fluid cultures pending   Imaging: Ct Abdomen Pelvis W Contrast  04/10/2015   CLINICAL DATA:  Diverticulitis  EXAM: CT ABDOMEN AND PELVIS WITH CONTRAST  TECHNIQUE: Multidetector CT imaging of the abdomen and pelvis was performed using the standard protocol following bolus administration of intravenous contrast.  CONTRAST:  OMNIPAQUE IOHEXOL 300 MG/ML  SOLN  COMPARISON:  None.  FINDINGS: Findings related to sigmoid diverticulitis with perforation are present in the left lower quadrant. There is wall thickening of the sigmoid colon associated with stranding and in [place] fat. There is a collection of extraluminal bowel gas adjacent to the inflamed sigmoid colon measuring 4.9 x 2.6 cm on image 83. There is associated fluid layering in the pelvis with a subtle enhancing rind of tissue. This  secondary fluid collection measures 1.9 x 2.9 cm. There is scattered free intraperitoneal gas again compatible with perforation. This extends above the liver and towards the pelvis. There is also scattered free-fluid in the mesentery.  There are dilated small bowel loops with a transition zone near the inflammatory process consistent with partial small bowel obstruction. Enteric contrast is present in the colon.  Mild periportal edema  Gallbladder, spleen, pancreas, adrenal glands are within normal limits  Right kidney is unremarkable. Tiny calculi in the left renal upper pole.  Normal appendix.  IMPRESSION: Findings are consistent with sigmoid diverticulitis and perforation. There is a large collection of extraluminal bowel gas adjacent to the sigmoid colon compatible with perforation. There is also a small layering fluid collection in the dependent portion of the pelvis which may represent infected fluid. There is also free intraperitoneal gas.  Partial small bowel obstruction secondary to the above inflammatory process.  Critical Value/emergent results were called by telephone at the time of interpretation on 04/10/2015 at 12:09 pm to Jonathan Buckley , who verbally acknowledged these results.   Electronically Signed   By: Jonathan Buckley.   On: 04/10/2015 12:09   Ct Image Guided Drainage By Percutaneous Catheter  04/10/2015   CLINICAL DATA:  Diverticulitis with a loculated gas collection adjacent to the sigmoid colon, free intraperitoneal gas, a small amount of fluid in the cul-de-sac.  EXAM: CT GUIDED PERITONEAL ABSCESS DRAIN CATHETER PLACEMENT  ANESTHESIA/SEDATION: Intravenous Fentanyl and Versed were administered as conscious  sedation during continuous cardiorespiratory monitoring by the radiology RN, with a total moderate sedation time of 15 minutes.  PROCEDURE: The procedure risks, benefits, and alternatives were explained to the patient. Questions regarding the procedure were encouraged and answered.  The patient understands and consents to the procedure.  Select axial scans through the pelvis were obtained. The for extraluminal gas collection was localized and an appropriate skin entry site was determined and marked.  The operative field was prepped with Betadinein a sterile fashion, and a sterile drape was applied covering the operative field. A sterile gown and sterile gloves were used for the procedure. Local anesthesia was provided with 1% Lidocaine.  Under CT fluoroscopic guidance, an 18 gauge trocar needle was advanced into the collection. An Amplatz guidewire advanced easily. Tract was dilated to facilitate placement of a 12 French pigtail drain catheter, placed in the dependent aspect of the decompressed collection adjacent to the sigmoid colon. No fluid could be aspirated. The catheter was flushed and secured externally with 0 Prolene suture and StatLock, placed to gravity bag. The patient tolerated the procedure well.  COMPLICATIONS: None immediate  FINDINGS: Limited CT again demonstrates a thin extraluminal gas collection adjacent to the distal sigmoid colon. Twelve French percutaneous drain catheter was placed within the collection, which was decompressed post drain placement.  IMPRESSION: 1. Technically successful CT-guided peritoneal abscess drain catheter placement.   Electronically Signed   By: Jonathan Buckley Buckley.   On: 04/10/2015 17:05    Labs:  CBC:  Recent Labs  04/09/15 0459 04/10/15 0940 04/11/15 0508 04/12/15 0510  WBC 11.1* 14.2* 11.3* 9.8  HGB 13.4 14.2 11.5* 11.2*  HCT 40.0 42.5 35.3* 33.9*  PLT 245 280 223 267    COAGS:  Recent Labs  04/10/15 1335  INR 1.25    BMP:  Recent Labs  04/06/15 2228 04/10/15 0940 04/11/15 0508 04/12/15 0510  NA 133* 136 135 133*  K 3.9 3.8 4.0 3.7  CL 98* 96* 101 102  CO2 GLUCOSE 125* 130* 126* 118*  BUN CALCIUM 9.2 9.3 8.5* 8.2*  CREATININE 0.77 0.92 1.04 0.97  GFRNONAA >60 >60 >60 >60  GFRAA >60  >60 >60 >60    LIVER FUNCTION TESTS:  Recent Labs  04/06/15 2228  BILITOT 0.7  AST 19  ALT 23  ALKPHOS 67  PROT 8.8*  ALBUMIN 3.8    Assessment and Plan: Jonathan Emery Physician Assistant Cosign Needed Radiology Progress Notes 04/11/2015 2:43 PM    Expand All Collapse All   Patient ID: Jonathan Buckley, male DOB: 25-May-1976, 39 y.o. MRN: [id number]    Referring Physician(s): CCS  Subjective:  Pt feeling a little better; denies N/V; no BM; can void now without "burning " sensation; has ambulated  Allergies: Review of patient's allergies indicates no known allergies.  Medications: Prior to Admission medications   Medication Sig Start Date End Date Taking? Authorizing Provider  ALPRAZolam Prudy Feeler) 0.5 MG tablet Take 0.5 mg by mouth daily.   Yes Historical Provider, MD  ibuprofen (ADVIL,MOTRIN) 200 MG tablet Take 400 mg by mouth every 6 (six) hours as needed for moderate pain.   Yes Historical Provider, MD  tiZANidine (ZANAFLEX) 2 MG tablet Take 2 mg by mouth every 6 (six) hours as needed for muscle spasms.   Yes Historical Provider, MD     Vital Signs: BP 127/78 mmHg  Pulse 100  Temp(Src) 98.2 F (36.8 C) (Oral)  Resp 17  Ht 5\' 11"  (1.803 m)  Wt 204 lb 0.9 oz (92.56 kg)  BMI 28.47 kg/m2  SpO2 98%  Physical Exam awake/alert; mid pelvic drain intact, dressing dry, mildly tender, output minimal- gas and small amt blood tinged fluid  Imaging: Ct Abdomen Pelvis W Contrast  04/10/2015 CLINICAL DATA: Diverticulitis EXAM: CT ABDOMEN AND PELVIS WITH CONTRAST TECHNIQUE: Multidetector CT imaging of the abdomen and pelvis was performed using the standard protocol following bolus administration of intravenous contrast. CONTRAST: OMNIPAQUE IOHEXOL 300 MG/ML SOLN COMPARISON: None. FINDINGS: Findings related to sigmoid diverticulitis with perforation are present in the left lower quadrant. There is wall thickening of the sigmoid  colon associated with stranding and in [place] fat. There is a collection of extraluminal bowel gas adjacent to the inflamed sigmoid colon measuring 4.9 x 2.6 cm on image 83. There is associated fluid layering in the pelvis with a subtle enhancing rind of tissue. This secondary fluid collection measures 1.9 x 2.9 cm. There is scattered free intraperitoneal gas again compatible with perforation. This extends above the liver and towards the pelvis. There is also scattered free-fluid in the mesentery. There are dilated small bowel loops with a transition zone near the inflammatory process consistent with partial small bowel obstruction. Enteric contrast is present in the colon. Mild periportal edema Gallbladder, spleen, pancreas, adrenal glands are within normal limits Right kidney is unremarkable. Tiny calculi in the left renal upper pole. Normal appendix. IMPRESSION: Findings are consistent with sigmoid diverticulitis and perforation. There is a large collection of extraluminal bowel gas adjacent to the sigmoid colon compatible with perforation. There is also a small layering fluid collection in the dependent portion of the pelvis which may represent infected fluid. There is also free intraperitoneal gas. Partial small bowel obstruction secondary to the above inflammatory process. Critical Value/emergent results were called by telephone at the time of interpretation on 04/10/2015 at 12:09 pm to Jonathan Buckley , who verbally acknowledged these results. Electronically Signed By: Jonathan Buckley. On: 04/10/2015 12:09   Ct Image Guided Drainage By Percutaneous Catheter  04/10/2015 CLINICAL DATA: Diverticulitis with a loculated gas collection adjacent to the sigmoid colon, free intraperitoneal gas, a small amount of fluid in the cul-de-sac. EXAM: CT GUIDED PERITONEAL ABSCESS DRAIN CATHETER PLACEMENT ANESTHESIA/SEDATION: Intravenous Fentanyl and Versed were administered as conscious sedation during  continuous cardiorespiratory monitoring by the radiology RN, with a total moderate sedation time of 15 minutes. PROCEDURE: The procedure risks, benefits, and alternatives were explained to the patient. Questions regarding the procedure were encouraged and answered. The patient understands and consents to the procedure. Select axial scans through the pelvis were obtained. The for extraluminal gas collection was localized and an appropriate skin entry site was determined and marked. The operative field was prepped with Betadinein a sterile fashion, and a sterile drape was applied covering the operative field. A sterile gown and sterile gloves were used for the procedure. Local anesthesia was provided with 1% Lidocaine. Under CT fluoroscopic guidance, an 18 gauge trocar needle was advanced into the collection. An Amplatz guidewire advanced easily. Tract was dilated to facilitate placement of a 12 French pigtail drain catheter, placed in the dependent aspect of the decompressed collection adjacent to the sigmoid colon. No fluid could be aspirated. The catheter was flushed and secured externally with 0 Prolene suture and StatLock, placed to gravity bag. The patient tolerated the procedure well. COMPLICATIONS: None immediate FINDINGS: Limited CT again demonstrates a thin extraluminal gas collection adjacent to the distal sigmoid colon.  Twelve French percutaneous drain catheter was placed within the collection, which was decompressed post drain placement. IMPRESSION: 1. Technically successful CT-guided peritoneal abscess drain catheter placement. Electronically Signed By: Jonathan Buckley Buckley Buckley. On: 04/10/2015 17:05    Labs:  CBC:  Recent Labs (within last 365 days)     Recent Labs  04/08/15 0511 04/09/15 0459 04/10/15 0940 04/11/15 0508  WBC 12.1* 11.1* 14.2* 11.3*  HGB 13.6 13.4 14.2 11.5*  HCT 40.0 40.0 42.5 35.3*  PLT 247 245 280 223      COAGS:  Recent Labs (within  last 365 days)     Recent Labs  04/10/15 1335  INR 1.25      BMP:  Recent Labs (within last 365 days)     Recent Labs  04/06/15 2228 04/10/15 0940 04/11/15 0508  NA 133* 136 135  K 3.9 3.8 4.0  CL 98* 96* 101  CO2 23 29 26   GLUCOSE 125* 130* 126*  BUN 10 8 9   CALCIUM 9.2 9.3 8.5*  CREATININE 0.77 0.92 1.04  GFRNONAA >60 >60 >60  GFRAA >60 >60 >60      LIVER FUNCTION TESTS:  Recent Labs (within last 365 days)     Recent Labs  04/06/15 2228  BILITOT 0.7  AST 19  ALT 23  ALKPHOS 67  PROT 8.8*  ALBUMIN 3.8      Assessment and Plan:  S/p drainage of pelvic air/fluid collection/abscess 5/31;  Temp 99, WBC 9.8; check final fluid cultures; other plans as per CCS.   Signed: D. Jeananne RamaKevin Allred 04/11/2015, 2:43 PM   I spent a total of 15 minutes in face to face in clinical consultation/evaluation, greater than 50% of which was counseling/coordinating care for pelvic abscess drain                                     Signed: D. Jeananne RamaKevin Allred 04/12/2015, 10:30 AM   I spent a total of 15 minutes  in face to face in clinical consultation/evaluation, greater than 50% of which was counseling/coordinating care for pelvic abscess drainage

## 2015-04-12 NOTE — Progress Notes (Signed)
[facility] Progress Note     Subjective: Continues to improve, no N/V, tolerating ice.  Ambulating well.  Abdominal pain is worse by the drain, but less in the LLQ.  Had 4 loose BM's and now passing flatus.    Objective: Vital signs in last 24 hours: Temp:  [98.2 F (36.8 C)-99 F (37.2 C)] 99 F (37.2 C) (06/02 0501) Pulse Rate:  [96-100] 97 (06/02 0501) Resp:  [17-18] 18 (06/02 0501) BP: (98-127)/(58-80) 98/58 mmHg (06/02 0501) SpO2:  [96 %-98 %] 96 % (06/02 0501) Last BM Date: 04/12/15  Intake/Output from previous day: 06/01 0701 - 06/02 0700 In: 2778.3 [I.V.:2428.3; IV Piggyback:350] Out: 10 [Drains:10] Intake/Output this shift: Total I/O In: 30 [P.O.:30] Out: -   PE: Gen:  Alert, NAD, pleasant Abd: Soft, still distended, still tender in LLQ and over IR perc drain in suprapubic area, +BS, no HSM, IR perc drain with minimal sanguinous purulent drainage  Lab Results:   Recent Labs  04/11/15 0508 04/12/15 0510  WBC 11.3* 9.8  HGB 11.5* 11.2*  HCT 35.3* 33.9*  PLT 223 267   BMET  Recent Labs  04/11/15 0508 04/12/15 0510  NA 135 133*  K 4.0 3.7  CL 101 102  CO2 26 23  GLUCOSE 126* 118*  BUN 9 6  CREATININE 1.04 0.97  CALCIUM 8.5* 8.2*   PT/INR  Recent Labs  04/10/15 1335  LABPROT 15.8*  INR 1.25   CMP     Component Value Date/Time   NA 133* 04/12/2015 0510   K 3.7 04/12/2015 0510   CL 102 04/12/2015 0510   CO2 23 04/12/2015 0510   GLUCOSE 118* 04/12/2015 0510   BUN 6 04/12/2015 0510   CREATININE 0.97 04/12/2015 0510   CALCIUM 8.2* 04/12/2015 0510   PROT 8.8* 04/06/2015 2228   ALBUMIN 3.8 04/06/2015 2228   AST 19 04/06/2015 2228   ALT 23 04/06/2015 2228   ALKPHOS 67 04/06/2015 2228   BILITOT 0.7 04/06/2015 2228   GFRNONAA >60 04/12/2015 0510   GFRAA >60 04/12/2015 0510   Lipase     Component Value Date/Time   LIPASE 23 04/06/2015 2228       Studies/Results: Ct Abdomen Pelvis W Contrast  04/10/2015   CLINICAL  DATA:  Diverticulitis  EXAM: CT ABDOMEN AND PELVIS WITH CONTRAST  TECHNIQUE: Multidetector CT imaging of the abdomen and pelvis was performed using the standard protocol following bolus administration of intravenous contrast.  CONTRAST:  100mL OMNIPAQUE IOHEXOL 300 MG/ML  SOLN  COMPARISON:  None.  FINDINGS: Findings related to sigmoid diverticulitis with perforation are present in the left lower quadrant. There is wall thickening of the sigmoid colon associated with stranding and in [place] fat. There is a collection of extraluminal bowel gas adjacent to the inflamed sigmoid colon measuring 4.9 x 2.6 cm on image 83. There is associated fluid layering in the pelvis with a subtle enhancing rind of tissue. This secondary fluid collection measures 1.9 x 2.9 cm. There is scattered free intraperitoneal gas again compatible with perforation. This extends above the liver and towards the pelvis. There is also scattered free-fluid in the mesentery.  There are dilated small bowel loops with a transition zone near the inflammatory process consistent with partial small bowel obstruction. Enteric contrast is present in the colon.  Mild periportal edema  Gallbladder, spleen, pancreas, adrenal glands are within normal limits  Right kidney is unremarkable. Tiny calculi in the left renal upper pole.  Normal appendix.  IMPRESSION: Findings  are consistent with sigmoid diverticulitis and perforation. There is a large collection of extraluminal bowel gas adjacent to the sigmoid colon compatible with perforation. There is also a small layering fluid collection in the dependent portion of the pelvis which may represent infected fluid. There is also free intraperitoneal gas.  Partial small bowel obstruction secondary to the above inflammatory process.  Critical Value/emergent results were called by telephone at the time of interpretation on 04/10/2015 at 12:09 pm to Dr. Karie Soda , who verbally acknowledged these results.   Electronically  Signed   By: Jolaine Click M.D.   On: 04/10/2015 12:09   Ct Image Guided Drainage By Percutaneous Catheter  04/10/2015   CLINICAL DATA:  Diverticulitis with a loculated gas collection adjacent to the sigmoid colon, free intraperitoneal gas, a small amount of fluid in the cul-de-sac.  EXAM: CT GUIDED PERITONEAL ABSCESS DRAIN CATHETER PLACEMENT  ANESTHESIA/SEDATION: Intravenous Fentanyl and Versed were administered as conscious sedation during continuous cardiorespiratory monitoring by the radiology RN, with a total moderate sedation time of 15 minutes.  PROCEDURE: The procedure risks, benefits, and alternatives were explained to the patient. Questions regarding the procedure were encouraged and answered. The patient understands and consents to the procedure.  Select axial scans through the pelvis were obtained. The for extraluminal gas collection was localized and an appropriate skin entry site was determined and marked.  The operative field was prepped with Betadinein a sterile fashion, and a sterile drape was applied covering the operative field. A sterile gown and sterile gloves were used for the procedure. Local anesthesia was provided with 1% Lidocaine.  Under CT fluoroscopic guidance, an 18 gauge trocar needle was advanced into the collection. An Amplatz guidewire advanced easily. Tract was dilated to facilitate placement of a 12 French pigtail drain catheter, placed in the dependent aspect of the decompressed collection adjacent to the sigmoid colon. No fluid could be aspirated. The catheter was flushed and secured externally with 0 Prolene suture and StatLock, placed to gravity bag. The patient tolerated the procedure well.  COMPLICATIONS: None immediate  FINDINGS: Limited CT again demonstrates a thin extraluminal gas collection adjacent to the distal sigmoid colon. Twelve French percutaneous drain catheter was placed within the collection, which was decompressed post drain placement.  IMPRESSION: 1.  Technically successful CT-guided peritoneal abscess drain catheter placement.   Electronically Signed   By: Corlis Leak M.D.   On: 04/10/2015 17:05    Anti-infectives: Anti-infectives    Start     Dose/Rate Route Frequency Ordered Stop   04/10/15 1300  fluconazole (DIFLUCAN) IVPB 400 mg     400 mg 100 mL/hr over 120 Minutes Intravenous Every 24 hours 04/10/15 1209     04/07/15 0600  piperacillin-tazobactam (ZOSYN) IVPB 3.375 g     3.375 g 12.5 mL/hr over 240 Minutes Intravenous 3 times per day 04/07/15 0421     04/07/15 0015  piperacillin-tazobactam (ZOSYN) IVPB 3.375 g     3.375 g 12.5 mL/hr over 240 Minutes Intravenous  Once 04/07/15 0006 04/07/15 0157       Assessment/Plan Diverticulitis with perforation HD #5 -CT scan (04/10/15) showed diverticulitis with perforation with large collection of extraluminal bowel gas and fluid collection in the pelvis. PSBO secondary to inflammatory process. IR placed drain catheter into left pelvic collection on 04/10/15.  31mL/24hr output. -NPO until at least tomorrow am, pain must also be resolved (which is still present) -Continue IV antibodies. Continue Zosyn for now D#5.Add fluconazole. 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 Ideally would like to wait for this attack to resolve, obtained colonoscopy and then colectomy. Do that 6-8 weeks from now. -If he markedly deteriorates, abdominal exploration with washout versus Hartman resection with colostomy. -VTE prophylaxis- SCDs, etc -Mobilize as tolerated to help recovery -Cultures pending but gram stain shows gram pos cocci in chains and gram neg rods  Fevers Leukocytosis - 8.9 Tachycardia - improved     LOS: 5 days    [name] 04/12/2015, 9:57 AM Pager: [phone]

## 2015-04-13 LAB — CBC
HCT: 33.9 % — ABNORMAL LOW (ref 39.0–52.0)
HEMOGLOBIN: 11.1 g/dL — AB (ref 13.0–17.0)
MCH: 28.3 pg (ref 26.0–34.0)
MCHC: 32.7 g/dL (ref 30.0–36.0)
MCV: 86.5 fL (ref 78.0–100.0)
Platelets: 319 10*3/uL (ref 150–400)
RBC: 3.92 MIL/uL — ABNORMAL LOW (ref 4.22–5.81)
RDW: 15 % (ref 11.5–15.5)
WBC: 6.8 10*3/uL (ref 4.0–10.5)

## 2015-04-13 LAB — WOUND CULTURE: Special Requests: NORMAL

## 2015-04-13 NOTE — Progress Notes (Signed)
[facility] Progress Note     Subjective: Pt feels good except c/o slight pain that comes and goes in suprapubic region.   No N/V, tolerating clears he was given this am.  Ambulating well.  Sleepy.  Wants to know when he can go home.  Having diarrhea and lots of flatus.  Objective: Vital signs in last 24 hours: Temp:  [98.4 F (36.9 C)-99.1 F (37.3 C)] 98.6 F (37 C) (06/03 0533) Pulse Rate:  [88-98] 98 (06/03 0533) Resp:  [18-20] 20 (06/03 0533) BP: (111-116)/(70-78) 111/78 mmHg (06/03 0533) SpO2:  [96 %-99 %] 99 % (06/03 0533) Last BM Date: 04/12/15  Intake/Output from previous day: 06/02 0701 - 06/03 0700 In: 1455.7 [P.O.:240; I.V.:1100.7; IV Piggyback:100] Out: 15 [Drains:15] Intake/Output this shift: Total I/O In: 245 [P.O.:240; Other:5] Out: -   PE: Gen:  Alert, NAD, pleasant Abd: Soft, still distended, still mildly tender in suprapubic area, +BS, no HSM, IR perc drain with minimal yellow purulent drainage  Lab Results:   Recent Labs  04/12/15 0510 04/13/15 0535  WBC 9.8 6.8  HGB 11.2* 11.1*  HCT 33.9* 33.9*  PLT 267 319   BMET  Recent Labs  04/11/15 0508 04/12/15 0510  NA 135 133*  K 4.0 3.7  CL 101 102  CO2 26 23  GLUCOSE 126* 118*  BUN 9 6  CREATININE 1.04 0.97  CALCIUM 8.5* 8.2*   PT/INR  Recent Labs  04/10/15 1335  LABPROT 15.8*  INR 1.25   CMP     Component Value Date/Time   NA 133* 04/12/2015 0510   K 3.7 04/12/2015 0510   CL 102 04/12/2015 0510   CO2 23 04/12/2015 0510   GLUCOSE 118* 04/12/2015 0510   BUN 6 04/12/2015 0510   CREATININE 0.97 04/12/2015 0510   CALCIUM 8.2* 04/12/2015 0510   PROT 8.8* 04/06/2015 2228   ALBUMIN 3.8 04/06/2015 2228   AST 19 04/06/2015 2228   ALT 23 04/06/2015 2228   ALKPHOS 67 04/06/2015 2228   BILITOT 0.7 04/06/2015 2228   GFRNONAA >60 04/12/2015 0510   GFRAA >60 04/12/2015 0510   Lipase     Component Value Date/Time   LIPASE 23 04/06/2015 2228        Studies/Results: No results found.  Anti-infectives: Anti-infectives    Start     Dose/Rate Route Frequency Ordered Stop   04/10/15 1300  fluconazole (DIFLUCAN) IVPB 400 mg     400 mg 100 mL/hr over 120 Minutes Intravenous Every 24 hours 04/10/15 1209     04/07/15 0600  piperacillin-tazobactam (ZOSYN) IVPB 3.375 g     3.375 g 12.5 mL/hr over 240 Minutes Intravenous 3 times per day 04/07/15 0421     04/07/15 0015  piperacillin-tazobactam (ZOSYN) IVPB 3.375 g     3.375 g 12.5 mL/hr over 240 Minutes Intravenous  Once 04/07/15 0006 04/07/15 0157       Assessment/Plan Diverticulitis with perforation HD #6 -CT scan (04/10/15) showed diverticulitis with perforation with large collection of extraluminal bowel gas and fluid collection in the pelvis. PSBO secondary to inflammatory process. IR placed drain catheter into left pelvic collection on 04/10/15. 6410mL/24hr output. -Start clears, advance to fulls in am if doing well -Continue IV antibodies. Continue Zosyn for now D#8. Fluconazole Day #4. -At some point he may benefit from segmental colonic resection to prevent further attacks since he is had a complicated attack at age only [age]. Ideally would like to wait for this attack to resolve, obtained colonoscopy and  then colectomy. Do that 6-8 weeks from now. -If he markedly deteriorates, abdominal exploration with washout versus Hartman resection with colostomy. -VTE prophylaxis- SCDs, etc -Mobilize as tolerated to help recovery -Cultures pending but gram stain shows gram pos cocci in chains and gram neg rods   Leukocytosis - 6.8 Tachycardia - resolved   LOS: 6 days    [name] 04/13/2015, 10:44 AM Pager: [phone]

## 2015-04-14 LAB — CBC
HCT: 35.1 % — ABNORMAL LOW (ref 39.0–52.0)
Hemoglobin: 11.5 g/dL — ABNORMAL LOW (ref 13.0–17.0)
MCH: 28 pg (ref 26.0–34.0)
MCHC: 32.8 g/dL (ref 30.0–36.0)
MCV: 85.6 fL (ref 78.0–100.0)
Platelets: 341 10*3/uL (ref 150–400)
RBC: 4.1 MIL/uL — AB (ref 4.22–5.81)
RDW: 14.7 % (ref 11.5–15.5)
WBC: 6.7 10*3/uL (ref 4.0–10.5)

## 2015-04-14 MED ORDER — LOPERAMIDE HCL 2 MG PO CAPS
4.0000 mg | ORAL_CAPSULE | Freq: Once | ORAL | Status: AC
  Administered 2015-04-14: 4 mg via ORAL
  Filled 2015-04-14: qty 2

## 2015-04-14 NOTE — Progress Notes (Signed)
Patient placed on Enteric Isolation per MD orders. Patient and family educated on enteric isolation,educational handouts also provided,patient,and family verbalized understanding. Will continue to monitor patient. No c/o pain or discomfort noted at this time..Marland Kitchen

## 2015-04-14 NOTE — Progress Notes (Signed)
Patient ID: Jonathan Buckley, male   DOB: 1976-01-23, 39 y.o.   MRN: [id number]  [facility]  HD#: 8   Subjective: Patient in bed, family at bedside.  Complains of loose BM's with incontinence.  Pain at drain site.  Objective: Vital signs in last 24 hours: Temp:  [98.3 F (36.8 C)-98.6 F (37 C)] 98.6 F (37 C) (06/04 0552) Pulse Rate:  [92-99] 92 (06/04 0552) Resp:  [16-20] 16 (06/04 0552) BP: (106-123)/(64-85) 106/64 mmHg (06/04 0552) SpO2:  [98 %-99 %] 98 % (06/04 0552) Last BM Date: 04/12/15  Intake/Output from previous day: 06/03 0701 - 06/04 0700 In: 1290 [P.O.:1080; IV Piggyback:150] Out: -  Intake/Output this shift:    Physical Exam: HEENT - sclerae clear, mucous membranes moist Chest - clear bilaterally Cor - RRR Abdomen - soft, BS present; drain lower mid abdomen with purulence, air in bag Ext - no edema, non-tender Neuro - alert & oriented, no focal deficits  Lab Results:   Recent Labs  04/13/15 0535 04/14/15 0538  WBC 6.8 6.7  HGB 11.1* 11.5*  HCT 33.9* 35.1*  PLT 319 341   BMET  Recent Labs  04/12/15 0510  NA 133*  K 3.7  CL 102  CO2 23  GLUCOSE 118*  BUN 6  CREATININE 0.97  CALCIUM 8.2*   PT/INR No results for input(s): LABPROT, INR in the last 72 hours. Comprehensive Metabolic Panel:    Component Value Date/Time   NA 133* 04/12/2015 0510   NA 135 04/11/2015 0508   K 3.7 04/12/2015 0510   K 4.0 04/11/2015 0508   CL 102 04/12/2015 0510   CL 101 04/11/2015 0508   CO2 23 04/12/2015 0510   CO2 26 04/11/2015 0508   BUN 6 04/12/2015 0510   BUN 9 04/11/2015 0508   CREATININE 0.97 04/12/2015 0510   CREATININE 1.04 04/11/2015 0508   GLUCOSE 118* 04/12/2015 0510   GLUCOSE 126* 04/11/2015 0508   CALCIUM 8.2* 04/12/2015 0510   CALCIUM 8.5* 04/11/2015 0508   AST 19 04/06/2015 2228   ALT 23 04/06/2015 2228   ALKPHOS 67 04/06/2015 2228   BILITOT 0.7 04/06/2015 2228   PROT 8.8* 04/06/2015 2228   ALBUMIN  3.8 04/06/2015 2228    Studies/Results: No results found.  Anti-infectives: Anti-infectives    Start     Dose/Rate Route Frequency Ordered Stop   04/10/15 1300  fluconazole (DIFLUCAN) IVPB 400 mg     400 mg 100 mL/hr over 120 Minutes Intravenous Every 24 hours 04/10/15 1209     04/07/15 0600  piperacillin-tazobactam (ZOSYN) IVPB 3.375 g     3.375 g 12.5 mL/hr over 240 Minutes Intravenous 3 times per day 04/07/15 0421     04/07/15 0015  piperacillin-tazobactam (ZOSYN) IVPB 3.375 g     3.375 g 12.5 mL/hr over 240 Minutes Intravenous  Once 04/07/15 0006 04/07/15 0157      Assessment & Plans: Diverticulitis with perforation  Clear liquid diet  Monitor drain output  WBC normal at 6.7  Will give one dose of loperamide for incontinence of stools  IV Zosyn, diflucan  Velora Hecklerodd M. Dara Beidleman, MD, [facility] Office: [phone]   Shyane Fossum Judie PetitM 04/14/2015

## 2015-04-14 NOTE — Progress Notes (Signed)
Patient ID: Jonathan Buckley, male   DOB: 15-Aug-1976, 39 y.o.   MRN: [id number]    Referring Physician(s): CCS  Subjective:  Pt has had 5-6 loose BM's since last night; still sore at drain site mid pelvis; denies N/V  Allergies: Review of patient's allergies indicates no known allergies.  Medications: Prior to Admission medications   Medication Sig Start Date End Date Taking? Authorizing Provider  ALPRAZolam Prudy Feeler(XANAX) 0.5 MG tablet Take 0.5 mg by mouth daily.   Yes Historical Provider, MD  ibuprofen (ADVIL,MOTRIN) 200 MG tablet Take 400 mg by mouth every 6 (six) hours as needed for moderate pain.   Yes Historical Provider, MD  tiZANidine (ZANAFLEX) 2 MG tablet Take 2 mg by mouth every 6 (six) hours as needed for muscle spasms.   Yes Historical Provider, MD     Vital Signs: BP 106/64 mmHg  Pulse 92  Temp(Src) 98.6 F (37 C) (Oral)  Resp 16  Ht 5\' 11"  (1.803 m)  Wt 204 lb 0.9 oz (92.56 kg)  BMI 28.47 kg/m2  SpO2 98%  Physical Exam  Pelvic drain intact, insertion site ok, mild-mod tender, primarily air and small amt brown fluid in drain bag, fluid cx's- mult org Imaging: Ct Image Guided Drainage By Percutaneous Catheter  04/10/2015   CLINICAL DATA:  Diverticulitis with a loculated gas collection adjacent to the sigmoid colon, free intraperitoneal gas, a small amount of fluid in the cul-de-sac.  EXAM: CT GUIDED PERITONEAL ABSCESS DRAIN CATHETER PLACEMENT  ANESTHESIA/SEDATION: Intravenous Fentanyl and Versed were administered as conscious sedation during continuous cardiorespiratory monitoring by the radiology RN, with a total moderate sedation time of 15 minutes.  PROCEDURE: The procedure risks, benefits, and alternatives were explained to the patient. Questions regarding the procedure were encouraged and answered. The patient understands and consents to the procedure.  Select axial scans through the pelvis were obtained. The for extraluminal gas collection was localized and an appropriate  skin entry site was determined and marked.  The operative field was prepped with Betadinein a sterile fashion, and a sterile drape was applied covering the operative field. A sterile gown and sterile gloves were used for the procedure. Local anesthesia was provided with 1% Lidocaine.  Under CT fluoroscopic guidance, an 18 gauge trocar needle was advanced into the collection. An Amplatz guidewire advanced easily. Tract was dilated to facilitate placement of a 12 French pigtail drain catheter, placed in the dependent aspect of the decompressed collection adjacent to the sigmoid colon. No fluid could be aspirated. The catheter was flushed and secured externally with 0 Prolene suture and StatLock, placed to gravity bag. The patient tolerated the procedure well.  COMPLICATIONS: None immediate  FINDINGS: Limited CT again demonstrates a thin extraluminal gas collection adjacent to the distal sigmoid colon. Twelve French percutaneous drain catheter was placed within the collection, which was decompressed post drain placement.  IMPRESSION: 1. Technically successful CT-guided peritoneal abscess drain catheter placement.   Electronically Signed   By: Corlis Leak  Hassell M.D.   On: 04/10/2015 17:05    Labs:  CBC:  Recent Labs  04/11/15 0508 04/12/15 0510 04/13/15 0535 04/14/15 0538  WBC 11.3* 9.8 6.8 6.7  HGB 11.5* 11.2* 11.1* 11.5*  HCT 35.3* 33.9* 33.9* 35.1*  PLT 223 267 319 341    COAGS:  Recent Labs  04/10/15 1335  INR 1.25    BMP:  Recent Labs  04/06/15 2228 04/10/15 0940 04/11/15 0508 04/12/15 0510  NA 133* 136 135 133*  K 3.9 3.8 4.0 3.7  CL 98* 96* 101 102  CO2 GLUCOSE 125* 130* 126* 118*  BUN CALCIUM 9.2 9.3 8.5* 8.2*  CREATININE 0.77 0.92 1.04 0.97  GFRNONAA >60 >60 >60 >60  GFRAA >60 >60 >60 >60    LIVER FUNCTION TESTS:  Recent Labs  04/06/15 2228  BILITOT 0.7  AST 19  ALT 23  ALKPHOS 67  PROT 8.8*  ALBUMIN 3.8    Assessment and  Plan: Diverticulitis with perf, s/p drainage of air/fluid pocket left pelvis 5/31; check c diff; WBC 6.7; afebrile; rec f/u CT next week to assess adequacy of drainage   Signed: D. Jeananne Rama 04/14/2015, 12:28 PM   I spent a total of 15 minutes in face to face in clinical consultation/evaluation, greater than 50% of which was counseling/coordinating care for LLQ abscess drain

## 2015-04-14 NOTE — Progress Notes (Signed)
Patient has no c/o pain or discomfort noted at this time,ice pack offered, patient refused. Will continue to monitor patient.

## 2015-04-15 LAB — CBC
HCT: 34.4 % — ABNORMAL LOW (ref 39.0–52.0)
Hemoglobin: 11.5 g/dL — ABNORMAL LOW (ref 13.0–17.0)
MCH: 28.7 pg (ref 26.0–34.0)
MCHC: 33.4 g/dL (ref 30.0–36.0)
MCV: 85.8 fL (ref 78.0–100.0)
Platelets: 386 10*3/uL (ref 150–400)
RBC: 4.01 MIL/uL — ABNORMAL LOW (ref 4.22–5.81)
RDW: 14.8 % (ref 11.5–15.5)
WBC: 5.6 10*3/uL (ref 4.0–10.5)

## 2015-04-15 LAB — CLOSTRIDIUM DIFFICILE BY PCR: Toxigenic C. Difficile by PCR: NEGATIVE

## 2015-04-15 MED ORDER — BISMUTH SUBSALICYLATE 262 MG/15ML PO SUSP
30.0000 mL | Freq: Three times a day (TID) | ORAL | Status: DC | PRN
  Administered 2015-04-15 – 2015-04-16 (×2): 30 mL via ORAL
  Filled 2015-04-15: qty 118

## 2015-04-15 NOTE — Progress Notes (Signed)
Patient is c/o an increase in the frequency of watery stools that he is having after the increase in his diet to full liquids. Patient reported that his pain has not gotten worse, just the increase in stool frequency. Patient reports that he has no notice of when he will have to have a BM and that it runs out like liquid. Patient is requesting a dose of immodium. Dr. Gerrit FriendsGerkin paged to advise him of the situation.

## 2015-04-15 NOTE — Progress Notes (Signed)
Patient ID: Jonathan Buckley, male   DOB: 1976/06/06, 39 y.o.   MRN: [id number]  General Surgery [facility]  POD#: 9  Subjective: Patient in bed, comfortable.  Less output from drain overnight.  Wants to advance diet.  Objective: Vital signs in last 24 hours: Temp:  [97.7 F (36.5 C)-98.2 F (36.8 C)] 98.2 F (36.8 C) (06/05 0545) Pulse Rate:  [76-85] 83 (06/05 0545) Resp:  [16-18] 18 (06/05 0545) BP: (108-125)/(69-84) 112/69 mmHg (06/05 0545) SpO2:  [97 %-100 %] 98 % (06/05 0545) Last BM Date: 04/14/15  Intake/Output from previous day: 06/04 0701 - 06/05 0700 In: 4131.7 [P.O.:1545; I.V.:1806.7; IV Piggyback:750] Out: 10 [Drains:10] Intake/Output this shift: Total I/O In: 50 [P.O.:50] Out: -   Physical Exam: HEENT - sclerae clear, mucous membranes moist Chest - clear bilaterally Cor - RRR Abdomen - soft, minimal tenderness; BS present; drain with small purulent output in bag Ext - no edema, non-tender Neuro - alert & oriented, no focal deficits  Lab Results:   Recent Labs  04/14/15 0538 04/15/15 0517  WBC 6.7 5.6  HGB 11.5* 11.5*  HCT 35.1* 34.4*  PLT 341 386   BMET No results for input(s): NA, K, CL, CO2, GLUCOSE, BUN, CREATININE, CALCIUM in the last 72 hours. PT/INR No results for input(s): LABPROT, INR in the last 72 hours. Comprehensive Metabolic Panel:    Component Value Date/Time   NA 133* 04/12/2015 0510   NA 135 04/11/2015 0508   K 3.7 04/12/2015 0510   K 4.0 04/11/2015 0508   CL 102 04/12/2015 0510   CL 101 04/11/2015 0508   CO2 23 04/12/2015 0510   CO2 26 04/11/2015 0508   BUN 6 04/12/2015 0510   BUN 9 04/11/2015 0508   CREATININE 0.97 04/12/2015 0510   CREATININE 1.04 04/11/2015 0508   GLUCOSE 118* 04/12/2015 0510   GLUCOSE 126* 04/11/2015 0508   CALCIUM 8.2* 04/12/2015 0510   CALCIUM 8.5* 04/11/2015 0508   AST 19 04/06/2015 2228   ALT 23 04/06/2015 2228   ALKPHOS 67 04/06/2015 2228   BILITOT 0.7 04/06/2015 2228   PROT 8.8* 04/06/2015 2228   ALBUMIN 3.8 04/06/2015 2228    Studies/Results: No results found.  Anti-infectives: Anti-infectives    Start     Dose/Rate Route Frequency Ordered Stop   04/10/15 1300  fluconazole (DIFLUCAN) IVPB 400 mg     400 mg 100 mL/hr over 120 Minutes Intravenous Every 24 hours 04/10/15 1209     04/07/15 0600  piperacillin-tazobactam (ZOSYN) IVPB 3.375 g     3.375 g 12.5 mL/hr over 240 Minutes Intravenous 3 times per day 04/07/15 0421     04/07/15 0015  piperacillin-tazobactam (ZOSYN) IVPB 3.375 g     3.375 g 12.5 mL/hr over 240 Minutes Intravenous  Once 04/07/15 0006 04/07/15 0157      Assessment & Plans: Diverticulitis with perforation Begin full liquid diet Monitor drain output IV Zosyn, diflucan  Repeat CT scan next 1-2 days per radiology  Jonathan Buckley. Jonathan Dittus, MD, [facility] Office: [phone]   Jonathan Buckley Judie PetitM 04/15/2015

## 2015-04-16 ENCOUNTER — Other Ambulatory Visit (HOSPITAL_COMMUNITY): Payer: BLUE CROSS/BLUE SHIELD

## 2015-04-16 DIAGNOSIS — K651 Peritoneal abscess: Secondary | ICD-10-CM | POA: Insufficient documentation

## 2015-04-16 LAB — ANAEROBIC CULTURE: Special Requests: NORMAL

## 2015-04-16 NOTE — Progress Notes (Signed)
Patient ID: Jonathan Buckley, male   DOB: [birth year], 39 y.o.   MRN: [id number]    Referring Physician(s): CCS  Subjective:  Patient doing okay ; a few loose stools- C. difficile negative; some soreness at drain site and pelvic region; denies nausea and vomiting.  Allergies: Review of patient's allergies indicates no known allergies.  Medications: Prior to Admission medications   Medication Sig Start Date End Date Taking? Authorizing Provider  ALPRAZolam Prudy Feeler(XANAX) 0.5 MG tablet Take 0.5 mg by mouth daily.   Yes Historical Provider, MD  ibuprofen (ADVIL,MOTRIN) 200 MG tablet Take 400 mg by mouth every 6 (six) hours as needed for moderate pain.   Yes Historical Provider, MD  tiZANidine (ZANAFLEX) 2 MG tablet Take 2 mg by mouth every 6 (six) hours as needed for muscle spasms.   Yes Historical Provider, MD     Vital Signs: BP 125/92 mmHg  Pulse 70  Temp(Src) 98.4 F (36.9 C) (Oral)  Resp 20  Ht 5\' 11"  (1.803 m)  Wt 204 lb 0.9 oz (92.56 kg)  BMI 28.47 kg/m2  SpO2 99%  Physical Exam patient awake/ alert, ant pelvic drain intact, insertion site okay, mildly tender. Output primarily air and few small cc's of feculent appearing fluid.   Imaging: No results found.  Labs:  CBC:  Recent Labs  04/12/15 0510 04/13/15 0535 04/14/15 0538 04/15/15 0517  WBC 9.8 6.8 6.7 5.6  HGB 11.2* 11.1* 11.5* 11.5*  HCT 33.9* 33.9* 35.1* 34.4*  PLT 267 319 341 386    COAGS:  Recent Labs  04/10/15 1335  INR 1.25    BMP:  Recent Labs  04/06/15 2228 04/10/15 0940 04/11/15 0508 04/12/15 0510  NA 133* 136 135 133*  K 3.9 3.8 4.0 3.7  CL 98* 96* 101 102  CO2 23 29 26 23   GLUCOSE 125* 130* 126* 118*  BUN 10 8 9 6   CALCIUM 9.2 9.3 8.5* 8.2*  CREATININE 0.77 0.92 1.04 0.97  GFRNONAA >60 >60 >60 >60  GFRAA >60 >60 >60 >60    LIVER FUNCTION TESTS:  Recent Labs  04/06/15 2228  BILITOT 0.7  AST 19  ALT 23  ALKPHOS 67  PROT 8.8*  ALBUMIN 3.8    Assessment and  Plan: Diverticulitis with perf, s/p drainage of air/fluid pocket left pelvis 5/31; afebrile; WBC normal ;C. difficile negative; check follow-up CT on 6/7   Signed: D. Jeananne RamaKevin Allred 04/16/2015, 3:07 PM   I spent a total of 15 minutes in face to face in clinical consultation/evaluation, greater than 50% of which was counseling/coordinating care for pelvic abscess drain

## 2015-04-16 NOTE — Progress Notes (Signed)
Patient ID: Jonathan Buckley, male   DOB: 08/18/1976, 39 y.o.   MRN: [id number]  [facility]  HD#: 10  Subjective: Patient comfortable in bed.  Some loose stools.  Ambulatory.  Tolerating full liquid diet.  Objective: Vital signs in last 24 hours: Temp:  [97.6 F (36.4 C)-98 F (36.7 C)] 98 F (36.7 C) (06/06 0556) Pulse Rate:  [75-84] 75 (06/06 0556) Resp:  [16-18] 16 (06/06 0556) BP: (111-116)/(75-76) 115/75 mmHg (06/06 0556) SpO2:  [98 %] 98 % (06/06 0556) Last BM Date: 04/15/15  Intake/Output from previous day: 06/05 0701 - 06/06 0700 In: 2054.2 [P.O.:530; I.V.:1164.2; IV Piggyback:350] Out: 15 [Drains:15] Intake/Output this shift:    Physical Exam: HEENT - sclerae clear, mucous membranes moist Neck - soft Chest - clear bilaterally Cor - RRR Abdomen - soft, mild distension; BS present; mild tenderness LLQ; drain with small purulent output, minimal gas Ext - no edema, non-tender Neuro - alert & oriented, no focal deficits  Lab Results:   Recent Labs  04/14/15 0538 04/15/15 0517  WBC 6.7 5.6  HGB 11.5* 11.5*  HCT 35.1* 34.4*  PLT 341 386   BMET No results for input(s): NA, K, CL, CO2, GLUCOSE, BUN, CREATININE, CALCIUM in the last 72 hours. PT/INR No results for input(s): LABPROT, INR in the last 72 hours. Comprehensive Metabolic Panel:    Component Value Date/Time   NA 133* 04/12/2015 0510   NA 135 04/11/2015 0508   K 3.7 04/12/2015 0510   K 4.0 04/11/2015 0508   CL 102 04/12/2015 0510   CL 101 04/11/2015 0508   CO2 23 04/12/2015 0510   CO2 26 04/11/2015 0508   BUN 6 04/12/2015 0510   BUN 9 04/11/2015 0508   CREATININE 0.97 04/12/2015 0510   CREATININE 1.04 04/11/2015 0508   GLUCOSE 118* 04/12/2015 0510   GLUCOSE 126* 04/11/2015 0508   CALCIUM 8.2* 04/12/2015 0510   CALCIUM 8.5* 04/11/2015 0508   AST 19 04/06/2015 2228   ALT 23 04/06/2015 2228   ALKPHOS 67 04/06/2015 2228   BILITOT 0.7 04/06/2015 2228   PROT  8.8* 04/06/2015 2228   ALBUMIN 3.8 04/06/2015 2228    Studies/Results: No results found.  Anti-infectives: Anti-infectives    Start     Dose/Rate Route Frequency Ordered Stop   04/10/15 1300  fluconazole (DIFLUCAN) IVPB 400 mg     400 mg 100 mL/hr over 120 Minutes Intravenous Every 24 hours 04/10/15 1209     04/07/15 0600  piperacillin-tazobactam (ZOSYN) IVPB 3.375 g     3.375 g 12.5 mL/hr over 240 Minutes Intravenous 3 times per day 04/07/15 0421     04/07/15 0015  piperacillin-tazobactam (ZOSYN) IVPB 3.375 g     3.375 g 12.5 mL/hr over 240 Minutes Intravenous  Once 04/07/15 0006 04/07/15 0157      Assessment & Plans: Diverticulitis with perforation Full liquid diet Monitor drain output IV Zosyn, diflucan Repeat CT scan tomorrow AM - ordered  Velora Hecklerodd M. Weldon Nouri, MD, [facility] Office: [phone]   Cheyann Blecha Judie PetitM 04/16/2015

## 2015-04-17 ENCOUNTER — Inpatient Hospital Stay (HOSPITAL_COMMUNITY): Payer: BLUE CROSS/BLUE SHIELD

## 2015-04-17 ENCOUNTER — Encounter (HOSPITAL_COMMUNITY): Payer: Self-pay | Admitting: Radiology

## 2015-04-17 MED ORDER — SACCHAROMYCES BOULARDII 250 MG PO CAPS: 250.0000 mg | ORAL_CAPSULE | Freq: Two times a day (BID) | ORAL | Status: AC

## 2015-04-17 MED ORDER — AMOXICILLIN-POT CLAVULANATE 875-125 MG PO TABS: 1.0000 | ORAL_TABLET | Freq: Two times a day (BID) | ORAL | Status: AC

## 2015-04-17 MED ORDER — ACETAMINOPHEN 325 MG PO TABS: 650.0000 mg | ORAL_TABLET | Freq: Four times a day (QID) | ORAL | Status: AC | PRN

## 2015-04-17 MED ORDER — IOHEXOL 300 MG/ML  SOLN
100.0000 mL | Freq: Once | INTRAMUSCULAR | Status: AC | PRN
  Administered 2015-04-17: 100 mL via INTRAVENOUS

## 2015-04-17 MED ORDER — HEPARIN SODIUM (PORCINE) 5000 UNIT/ML IJ SOLN
5000.0000 [IU] | Freq: Three times a day (TID) | INTRAMUSCULAR | Status: DC
  Filled 2015-04-17 (×3): qty 1

## 2015-04-17 NOTE — Discharge Summary (Signed)
[facility] Discharge [facility]   Patient ID: Jonathan Buckley MRN: [id number] DOB/AGE: Aug 19, 1976 39 y.o.  Admit date: 04/06/2015 Discharge date: 04/17/2015  Admitting Diagnosis: Diverticulitis of the sigmoid colon with perforation and abscess   Discharge Diagnosis Patient Active Problem List   Diagnosis Date Noted  . Abscess of male pelvis   . Diverticulitis of sigmoid colon with perforation 04/07/2015     Consultants Interventional Radiology (Dr. Deanne CofferHassell)   Imaging: Ct Abdomen Pelvis W Contrast  04/17/2015   CLINICAL DATA:  Followup abscess drain  EXAM: CT ABDOMEN AND PELVIS WITH CONTRAST  TECHNIQUE: Multidetector CT imaging of the abdomen and pelvis was performed using the standard protocol following bolus administration of intravenous contrast.  CONTRAST:  100mL OMNIPAQUE IOHEXOL 300 MG/ML  SOLN  COMPARISON:  04/10/2015  FINDINGS: Minimal subsegmental atelectasis at the lung bases.  A drain has been placed in the predominately gas filled abscess within the left lower quadrant. The cavity is completely decompressed.  Scattered fluid in the mesenteric has resolved. Scattered free intraperitoneal gas has nearly resolved with some residual gas over the dome of the liver and in the left upper quadrant.  The fluid collection layering in the pelvis is slightly smaller and today measures 1.6 x 3.0 cm. Previously, it measured 1.9 x 2.9 cm. There is increasing enhancement of the rim of this fluid collection. It does not contain any gas. The remainder of the gas in the pelvis is felt to be intraluminal normal bowel gas. Mild wall thickening and inflammatory changes in the adjacent fat persist.  Stable appearance of the organs.  IMPRESSION: Drain placement into the predominantly gas-filled abscess has been performed. The abscess cavity has resolved.  Free intraperitoneal gas and free-fluid in the mesenteric has improved and resolved respectively.  Small dependent fluid collection in the pelvis  with rim enhancement is slightly smaller. It does not contain gas but infected fluid collection cannot be excluded. It is amenable to right trans gluteal aspiration but too small for drain placement.   Electronically Signed   By: Jolaine ClickArthur  Hoss M.D.   On: 04/17/2015 09:15    Procedures Dr. Deanne CofferHassell (04/10/15) - CT image guided percutaneous drainage of left pelvic collection (3637f)  Hospital Course:  39 y/[place] was in his usual state of health until 3 days ago (04/04/15) when he developed initially nausea without vomiting and diarrhea. He felt generally ill and had some subjective fever and chills as well. This continued for a couple of days and then yesterday he began to develop the gradual onset of abdominal pain. The pain is moderately severe and located in the mid lower abdomen just beneath his umbilicus. No relieving or exacerbating factors. The diarrhea stopped yesterday but he continued to have pain and nausea and felt chilled. He presented to [facility] for evaluation and the CT scan below was obtained. He has no previous history of GI disease. Denies hematochezia or melena. He has some chronic urinary frequency which has been evaluated by urologist in the past and this is no different.  [facility] transferred to the floor for medical management of his diverticulitis with abscess.  IR placed a CT guided perc drain on 04/10/15.  He was maintained on NPO for 72 hours prior to advancing diet which allowed his pain to improve and his partial bowel obstruction to resolve.  Bowel function returned.  WBC trended down to normal and pain has resolved.  Diet  was advanced as tolerated.  On 04/17/15 a repeat CT scan was obtained which showed collapse of the abscess cavity and resolution of gas.  The fluid collection in the pelvis was much improved.  He continues to put out some purulent yellow drainage from his perc drain each day and thus this will be  continued.  He is tolerating a regular diet.  He will be trained on how to flush the perc drain daily and record output.  He will follow up with Dr. Michaell Cowing in 10-14 days for a recheck and be seen in IR drain clinic in 1-2 weeks.  He will need to be set up for a colonoscopy in 6-8 weeks down the road.  At that time they will consider doing an elective sigmoid colectomy.  He will continue his IR perc drain for now and will continue with flushing 5mL TID to keep it patent per IR's recommendations.  He will record the output daily.  He will remain on low residue/low fiber diet.  He is encouraged to take a stool softener as needed.  On HD #11, the patient was voiding well, tolerating diet, ambulating well, pain well controlled, vital signs stable, and felt stable for discharge home.  Patient will follow up in our office and knows to call with questions or concerns.  He should call our office to check the appointment date and time by Friday.   Physical Exam: General:  Alert, NAD, pleasant, comfortable Abd:  Soft, ND, minimal tenderness, drain with minimal yellow purulent drainage     Medication List    TAKE these medications        acetaminophen 325 MG tablet  Commonly known as:  TYLENOL  Take 2 tablets (650 mg total) by mouth every 6 (six) hours as needed (elevated temp).     ALPRAZolam 0.5 MG tablet  Commonly known as:  XANAX  Take 0.5 mg by mouth daily.     amoxicillin-clavulanate 875-125 MG per tablet  Commonly known as:  AUGMENTIN  Take 1 tablet by mouth 2 (two) times daily.     ibuprofen 200 MG tablet  Commonly known as:  ADVIL,MOTRIN  Take 400 mg by mouth every 6 (six) hours as needed for moderate pain.     saccharomyces boulardii 250 MG capsule  Commonly known as:  FLORASTOR  Take 1 capsule (250 mg total) by mouth 2 (two) times daily.     tiZANidine 2 MG tablet  Commonly known as:  ZANAFLEX  Take 2 mg by mouth every 6 (six) hours as needed for muscle spasms.          Follow-up Information    Schedule an appointment as soon as possible for a visit with Ardeth Sportsman., MD.   Specialty:  General Surgery   Why:  For post-hospital follow up regarding your diverticulitis within 10-14 days.  Call the office to confirm date/time.   Contact information:   [address] [place] [address] [phone]       Follow up with [facility]. Schedule an appointment as soon as possible for a visit in 1 week.   Specialty:  Radiology   Why:  For post-hospital follow up regarding a follow up CT scan and determine if drain can be discontinued.   Contact information:   [address] [id number] mc [place] [address] [phone]      Signed: Candiss Norse [facility] [phone]  04/17/2015, 1:23 PM

## 2015-04-17 NOTE — Discharge Instructions (Signed)
Diverticulitis °Diverticulitis is inflammation or infection of small pouches in your colon that form when you have a condition called diverticulosis. The pouches in your colon are called diverticula. Your colon, or large intestine, is where water is absorbed and stool is formed. °Complications of diverticulitis can include: °· Bleeding. °· Severe infection. °· Severe pain. °· Perforation of your colon. °· Obstruction of your colon. °CAUSES  °Diverticulitis is caused by bacteria. °Diverticulitis happens when stool becomes trapped in diverticula. This allows bacteria to grow in the diverticula, which can lead to inflammation and infection. °RISK FACTORS °People with diverticulosis are at risk for diverticulitis. Eating a diet that does not include enough fiber from fruits and vegetables may make diverticulitis more likely to develop. °SYMPTOMS  °Symptoms of diverticulitis may include: °· Abdominal pain and tenderness. The pain is normally located on the left side of the abdomen, but may occur in other areas. °· Fever and chills. °· Bloating. °· Cramping. °· Nausea. °· Vomiting. °· Constipation. °· Diarrhea. °· Blood in your stool. °DIAGNOSIS  °Your health care provider will ask you about your medical history and do a physical exam. You may need to have tests done because many medical conditions can cause the same symptoms as diverticulitis. Tests may include: °· Blood tests. °· Urine tests. °· Imaging tests of the abdomen, including X-rays and CT scans. °When your condition is under control, your health care provider may recommend that you have a colonoscopy. A colonoscopy can show how severe your diverticula are and whether something else is causing your symptoms. °TREATMENT  °Most cases of diverticulitis are mild and can be treated at home. Treatment may include: °· Taking over-the-counter pain medicines. °· Following a clear liquid diet. °· Taking antibiotic medicines by mouth for 7-10 days. °More severe cases may  be treated at a hospital. Treatment may include: °· Not eating or drinking. °· Taking prescription pain medicine. °· Receiving antibiotic medicines through an IV tube. °· Receiving fluids and nutrition through an IV tube. °· Surgery. °HOME CARE INSTRUCTIONS  °· Follow your health care provider's instructions carefully. °· Follow a full liquid diet or other diet as directed by your health care provider. After your symptoms improve, your health care provider may tell you to change your diet. He or she may recommend you eat a high-fiber diet. Fruits and vegetables are good sources of fiber. Fiber makes it easier to pass stool. °· Take fiber supplements or probiotics as directed by your health care provider. °· Only take medicines as directed by your health care provider. °· Keep all your follow-up appointments. °SEEK MEDICAL CARE IF:  °· Your pain does not improve. °· You have a hard time eating food. °· Your bowel movements do not return to normal. °SEEK IMMEDIATE MEDICAL CARE IF:  °· Your pain becomes worse. °· Your symptoms do not get better. °· Your symptoms suddenly get worse. °· You have a fever. °· You have repeated vomiting. °· You have bloody or black, tarry stools. °MAKE SURE YOU:  °· Understand these instructions. °· Will watch your condition. °· Will get help right away if you are not doing well or get worse. °Document Released: 08/06/2005 Document Revised: 11/01/2013 Document Reviewed: 09/21/2013 °Exit[employer]® Patient Information ©2015 [employer]. This information is not intended to replace advice given to you by your health care provider. Make sure you discuss any questions you have with your health care provider. ° °Low-Fiber Diet °Fiber is found in fruits, vegetables, and whole grains. A low-fiber   diet restricts fibrous foods that are not digested in the small intestine. A diet containing about 10-15 grams of fiber per day is considered low fiber. Low-fiber diets may be used to:  Promote healing and  rest the bowel during intestinal flare-ups.  Prevent blockage of a partially obstructed or narrowed gastrointestinal tract.  Reduce fecal weight and volume.  Slow the movement of feces. You may be on a low-fiber diet as a transitional diet following surgery, after an injury (trauma), or because of a short (acute) or lifelong (chronic) illness. Your health care provider will determine the length of time you need to stay on this diet.  WHAT DO I NEED TO KNOW ABOUT A LOW-FIBER DIET? Always check the fiber content on the packaging's Nutrition Facts label, especially on foods from the grains list. Ask your dietitian if you have questions about specific foods that are related to your condition, especially if the food is not listed below. In general, a low-fiber food will have less than 2 g of fiber. WHAT FOODS CAN I EAT? Grains All breads and crackers made with white flour. Sweet rolls, doughnuts, waffles, pancakes, [place] toast, bagels. Pretzels, Melba toast, zwieback. Well-cooked cereals, such as cornmeal, farina, or cream cereals. Dry cereals that do not contain whole grains, fruit, or nuts, such as refined corn, wheat, rice, and oat cereals. Potatoes prepared any way without skins, plain pastas and noodles, refined white rice. Use white flour for baking and making sauces. Use allowed list of grains for casseroles, dumplings, and puddings.  Vegetables Strained tomato and vegetable juices. Fresh lettuce, cucumber, spinach. Well-cooked (no skin or pulp) or canned vegetables, such as asparagus, bean sprouts, beets, carrots, green beans, mushrooms, potatoes, pumpkin, spinach, yellow squash, tomato sauce/puree, turnips, yams, and zucchini. Keep servings limited to  cup.  Fruits All fruit juices except prune juice. Cooked or canned fruits without skin and seeds, such as applesauce, apricots, cherries, fruit cocktail, grapefruit, grapes, mandarin oranges, melons, peaches, pears, pineapple, and plums. Fresh  fruits without skin, such as apricots, avocados, bananas, melons, pineapple, nectarines, and peaches. Keep servings limited to  cup or 1 piece.  Meat and Other Protein Sources Ground or well-cooked tender beef, ham, veal, lamb, pork, or poultry. Eggs, plain cheese. Fish, oysters, shrimp, lobster, and other seafood. Liver, organ meats. Smooth nut butters. Dairy All milk products and alternative dairy substitutes, such as soy, rice, almond, and coconut, not containing added whole nuts, seeds, or added fruit. Beverages Decaf coffee, fruit, and vegetable juices or smoothies (small amounts, with no pulp or skins, and with fruits from allowed list), sports drinks, herbal tea. Condiments Ketchup, mustard, vinegar, cream sauce, cheese sauce, cocoa powder. Spices in moderation, such as allspice, basil, bay leaves, celery powder or leaves, cinnamon, cumin powder, curry powder, ginger, mace, marjoram, onion or garlic powder, oregano, paprika, parsley flakes, ground pepper, rosemary, sage, savory, tarragon, thyme, and turmeric. Sweets and Desserts Plain cakes and cookies, pie made with allowed fruit, pudding, custard, cream pie. Gelatin, fruit, ice, sherbet, frozen ice pops. Ice cream, ice milk without nuts. Plain hard candy, honey, jelly, molasses, syrup, sugar, chocolate syrup, gumdrops, marshmallows. Limit overall sugar intake.  Fats and Oil Margarine, butter, cream, mayonnaise, salad oils, plain salad dressings made from allowed foods. Choose healthy fats such as olive oil, canola oil, and omega-3 fatty acids (such as found in salmon or tuna) when possible.  Other Bouillon, broth, or cream soups made from allowed foods. Any strained soup. Casseroles or mixed dishes made with  allowed foods. The items listed above may not be a complete list of recommended foods or beverages. Contact your dietitian for more options.  WHAT FOODS ARE NOT RECOMMENDED? Grains All whole wheat and whole grain breads and crackers.  Multigrains, rye, bran seeds, nuts, or coconut. Cereals containing whole grains, multigrains, bran, coconut, nuts, raisins. Cooked or dry oatmeal, steel-cut oats. Coarse wheat cereals, granola. Cereals advertised as high fiber. Potato skins. Whole grain pasta, wild or brown rice. Popcorn. Coconut flour. Bran, buckwheat, corn bread, multigrains, rye, wheat germ.  Vegetables Fresh, cooked or canned vegetables, such as artichokes, asparagus, beet greens, broccoli, Brussels sprouts, cabbage, celery, cauliflower, corn, eggplant, kale, legumes or beans, okra, peas, and tomatoes. Avoid large servings of any vegetables, especially raw vegetables.  Fruits Fresh fruits, such as apples with or without skin, berries, cherries, figs, grapes, grapefruit, guavas, kiwis, mangoes, oranges, papayas, pears, persimmons, pineapple, and pomegranate. Prune juice and juices with pulp, stewed or dried prunes. Dried fruits, dates, raisins. Fruit seeds or skins. Avoid large servings of all fresh fruits. Meats and Other Protein Sources Tough, fibrous meats with gristle. Chunky nut butter. Cheese made with seeds, nuts, or other foods not recommended. Nuts, seeds, legumes (beans, including baked beans), dried peas, beans, lentils.  Dairy Yogurt or cheese that contains nuts, seeds, or added fruit.  Beverages Fruit juices with high pulp, prune juice. Caffeinated coffee and teas.  Condiments Coconut, maple syrup, pickles, olives. Sweets and Desserts Desserts, cookies, or candies that contain nuts or coconut, chunky peanut butter, dried fruits. Jams, preserves with seeds, marmalade. Large amounts of sugar and sweets. Any other dessert made with fruits from the not recommended list.  Other Soups made from vegetables that are not recommended or that contain other foods not recommended.  The items listed above may not be a complete list of foods and beverages to avoid. Contact your dietitian for more information. Document Released:  04/18/2002 Document Revised: 11/01/2013 Document Reviewed: 09/19/2013 [facility] Patient Information 2015 [place], [place]. This information is not intended to replace advice given to you by your health care provider. Make sure you discuss any questions you have with your health care provider.  Percutaneous Abscess Drain An abscess is a collection of infected fluid inside the body. Your health care provider may decide to remove or drain the infected fluid from the area by placing a thin needle into the abscess. Usually, a small tube is left in place to drain the abscess fluid. The abscess fluid may take a few days to drain. LET [facility] CARE PROVIDER KNOW ABOUT:  Any allergies you have.  All medicines you are taking, including vitamins, herbs, eye drops, creams, and over-the-counter medicines. This includes steroid medicines by mouth or cream.  Previous problems you or members of your family have had with the use of anesthetics.  Any blood disorders you have.  Previous surgeries you have had.  Possibility of pregnancy, if this applies.  Medical conditions you have.  Any history of smoking. RISKS AND COMPLICATIONS Generally, this is a safe procedure. However, problems can occur and include:   Infection.  Allergic reaction to materials used (such as contrast dye).  Damage to a nearby organ or tissue.  Bleeding.  Blockage of a tube placed to drain the abscess, requiring placement of a new drainage tube.  A need to repeat the procedure.  Failure of the procedure to adequately drain the abscess, requiring an open surgical procedure to do so. BEFORE THE PROCEDURE   Ask your health  care provider about:  Changing or stopping your regular medicines. This is especially important if you are taking diabetes medicines or blood thinners.  Taking medicines such as aspirin and ibuprofen. These medicines can thin your blood. Do not take these medicines before your procedure if your health  care provider asks you not to.  Your health care provider may do some blood or urine tests. These will help your health care provider learn how well your kidneys and liver are working and how well your blood clots.  Do not eat or drink anything after midnight on the night before the procedure or as directed by your health care provider.  Make arrangements for someone to drive you home after the procedure.  PROCEDURE   An IV tube will be placed in your arm. Medicine will be able to flow directly into your body through this tube.  You will lie on an X-ray table.  Your heart rate, blood pressure, and breathing will be monitored.   Your oxygen level will also be watched during the procedure. Supplemental oxygen may be given if necessary.  The skin around the area where the drainage tube (catheter) will be placed will be cleaned and numbed.  A small cut (incision) will then be made to insert the drainage tube. The drainage tube will be inserted using X-ray or CT scan to help direct where it should be placed.  The drainage tube will be guided into the abscess to drain the infected fluid.  The drainage tube may stay in place and be connected to a bag outside your body. It will stay until the fluid has stopped draining and the infection is gone. AFTER THE PROCEDURE  You will be taken to a recovery area where you will stay until the medicines have worn off.  You will stay in bed for several hours.  Your progress will be monitored.   Your blood pressure and pulse will be checked often.   The area of the incision will be checked often.  You may have some pain or feel sick. Tell your health care provider.  As you begin to feel better, you may be given ice, fluids, and food.   When you can walk, drink, eat, and use the bathroom, you may be able to go home. Document Released: 03/13/2014 Document Reviewed: 12/16/2013 [facility] Patient Information 2015 [place], [place]. This  information is not intended to replace advice given to you by your health care provider. Make sure you discuss any questions you have with your health care provider.  Percutaneous Abscess Drain, Care After  Refer to this sheet in the next few weeks. These instructions provide you with information on caring for yourself after your procedure. Your health care provider may also give you more specific instructions. Your treatment has been planned according to current medical practices, but problems sometimes occur. Call your health care provider if you have any problems or questions after your procedure. WHAT TO EXPECT AFTER THE PROCEDURE After your procedure, it is typical to have the following:   A small amount of discomfort in the area where the drainage tube was placed.  A small amount of bruising around the area where the drainage tube was placed.  Sleepiness and fatigue for the rest of the day from the medicines used. HOME CARE INSTRUCTIONS  Rest at home for 1-2 days following your procedure or as directed by your health care provider.  If you go home right after the procedure, plan to have someone with  you for 24 hours.  Do not take a bathor shower for 24 hours after your procedure.  Take medicines only as directed by your health care provider. Ask your health care provider when you can resume taking any normal medicines.  Change bandages (dressings) as directed.   You may be told to record the amount of drainage from the bag every time you empty it. Follow your health care provider's directions for emptying the bag. Write down the amount of drainage, the date, and the time you emptied it.  Call your health care provider when the drain is putting out less than 10 mL of drainage per day for 2-3 days in a row or as directed by your health care provider.  Follow your health care provider's instructions for cleaning the drainage tube. You may need to clean the tube every day so that it  does not clog. SEEK MEDICAL CARE IF:  You have increased bleeding (more than a small spot) from the site where the drainage tube was placed.  You have redness, swelling, or increasing pain around the site where the drainage tube was placed.  You notice a discharge or bad smell coming from the site where the drainage tube was placed.  You have a fever or chills.  You have pain that is not helped by medicine.  SEEK IMMEDIATE MEDICAL CARE IF:  There is leakage around the drainage tube.  The drainage tube pulls out.  You suddenly stop having drainage from the tube.  You suddenly have blood in the drainage fluid.  You become dizzy or faint.  You develop a rash.   You have nausea or vomiting.  You have difficulty breathing, feel short of breath, or feel faint.   You develop chest pain.  You have problems with your speech or vision.  You have trouble balancing or moving your arms or legs. Document Released: 03/13/2014 Document Reviewed: 12/16/2013 [facility] Patient Information 2015 [place], [place]. This information is not intended to replace advice given to you by your health care provider. Make sure you discuss any questions you have with your health care provider.

## 2015-04-17 NOTE — Progress Notes (Signed)
Educated patient on how to flush drain with 5 ml 3 times a day. Patient demonstrated understanding by flushing drain with verbal guidance. Gave patient d/c instructions and medication prescription. Patient stated understanding of both. Patient stated he had no additional questions or concerns.

## 2015-04-19 ENCOUNTER — Other Ambulatory Visit: Payer: Self-pay

## 2015-04-19 ENCOUNTER — Other Ambulatory Visit: Payer: Self-pay | Admitting: *Deleted

## 2015-04-19 DIAGNOSIS — K5732 Diverticulitis of large intestine without perforation or abscess without bleeding: Secondary | ICD-10-CM

## 2015-04-20 ENCOUNTER — Other Ambulatory Visit (HOSPITAL_COMMUNITY): Payer: Self-pay | Admitting: Interventional Radiology

## 2015-04-20 DIAGNOSIS — K651 Peritoneal abscess: Secondary | ICD-10-CM

## 2015-04-20 DIAGNOSIS — K5720 Diverticulitis of large intestine with perforation and abscess without bleeding: Secondary | ICD-10-CM

## 2015-04-24 ENCOUNTER — Ambulatory Visit (HOSPITAL_COMMUNITY): Payer: BLUE CROSS/BLUE SHIELD

## 2015-04-26 ENCOUNTER — Ambulatory Visit (HOSPITAL_COMMUNITY)
Admission: RE | Admit: 2015-04-26 | Discharge: 2015-04-26 | Disposition: A | Discharge status: Home / Self Care | Payer: BLUE CROSS/BLUE SHIELD | Source: Ambulatory Visit | Attending: Interventional Radiology | Admitting: Interventional Radiology

## 2015-04-26 ENCOUNTER — Ambulatory Visit (HOSPITAL_COMMUNITY)
Admission: RE | Admit: 2015-04-26 | Discharge: 2015-04-26 | Disposition: A | Discharge status: Home / Self Care | Payer: BLUE CROSS/BLUE SHIELD | Source: Ambulatory Visit | Attending: Surgery | Admitting: Surgery

## 2015-04-26 DIAGNOSIS — K632 Fistula of intestine: Secondary | ICD-10-CM | POA: Insufficient documentation

## 2015-04-26 DIAGNOSIS — K5720 Diverticulitis of large intestine with perforation and abscess without bleeding: Secondary | ICD-10-CM | POA: Diagnosis not present

## 2015-04-26 DIAGNOSIS — Z4889 Encounter for other specified surgical aftercare: Secondary | ICD-10-CM | POA: Diagnosis not present

## 2015-04-26 DIAGNOSIS — K651 Peritoneal abscess: Secondary | ICD-10-CM

## 2015-04-26 MED ORDER — IOHEXOL 300 MG/ML  SOLN
100.0000 mL | Freq: Once | INTRAMUSCULAR | Status: AC | PRN
  Administered 2015-04-26: 80 mL via INTRAVENOUS

## 2015-04-26 MED ORDER — IOHEXOL 300 MG/ML  SOLN
50.0000 mL | Freq: Once | INTRAMUSCULAR | Status: AC | PRN
  Administered 2015-04-26: 10 mL

## 2015-04-30 ENCOUNTER — Other Ambulatory Visit: Payer: Self-pay

## 2015-04-30 DIAGNOSIS — K5720 Diverticulitis of large intestine with perforation and abscess without bleeding: Secondary | ICD-10-CM

## 2015-04-30 DIAGNOSIS — K5732 Diverticulitis of large intestine without perforation or abscess without bleeding: Secondary | ICD-10-CM

## 2015-05-01 ENCOUNTER — Telehealth (HOSPITAL_COMMUNITY): Payer: Self-pay | Admitting: Interventional Radiology

## 2015-05-01 NOTE — Telephone Encounter (Signed)
Called pt to schedule his next drain clinic appt. Left VM for him to call to set up appt. JM

## 2015-05-09 ENCOUNTER — Encounter: Payer: Self-pay | Admitting: Gastroenterology

## 2015-05-10 ENCOUNTER — Ambulatory Visit: Payer: BLUE CROSS/BLUE SHIELD | Admitting: Gastroenterology

## 2015-05-15 ENCOUNTER — Ambulatory Visit (HOSPITAL_COMMUNITY): Payer: BLUE CROSS/BLUE SHIELD

## 2015-05-17 ENCOUNTER — Ambulatory Visit (HOSPITAL_COMMUNITY)
Admission: RE | Admit: 2015-05-17 | Discharge: 2015-05-17 | Disposition: A | Discharge status: Home / Self Care | Payer: BLUE CROSS/BLUE SHIELD | Source: Ambulatory Visit | Attending: Surgery | Admitting: Surgery

## 2015-05-17 ENCOUNTER — Ambulatory Visit (HOSPITAL_COMMUNITY)
Admission: RE | Admit: 2015-05-17 | Discharge: 2015-05-17 | Disposition: A | Discharge status: Home / Self Care | Payer: BLUE CROSS/BLUE SHIELD | Source: Ambulatory Visit | Attending: Interventional Radiology | Admitting: Interventional Radiology

## 2015-05-17 ENCOUNTER — Encounter (HOSPITAL_COMMUNITY): Payer: Self-pay

## 2015-05-17 DIAGNOSIS — K5720 Diverticulitis of large intestine with perforation and abscess without bleeding: Secondary | ICD-10-CM

## 2015-05-17 DIAGNOSIS — Z4803 Encounter for change or removal of drains: Secondary | ICD-10-CM | POA: Insufficient documentation

## 2015-05-17 DIAGNOSIS — N200 Calculus of kidney: Secondary | ICD-10-CM | POA: Diagnosis not present

## 2015-05-17 DIAGNOSIS — K5732 Diverticulitis of large intestine without perforation or abscess without bleeding: Secondary | ICD-10-CM

## 2015-05-17 DIAGNOSIS — K5780 Diverticulitis of intestine, part unspecified, with perforation and abscess without bleeding: Secondary | ICD-10-CM | POA: Insufficient documentation

## 2015-05-17 DIAGNOSIS — Z4889 Encounter for other specified surgical aftercare: Secondary | ICD-10-CM | POA: Diagnosis present

## 2015-05-17 DIAGNOSIS — N400 Enlarged prostate without lower urinary tract symptoms: Secondary | ICD-10-CM | POA: Diagnosis not present

## 2015-05-17 MED ORDER — IOHEXOL 300 MG/ML  SOLN
100.0000 mL | Freq: Once | INTRAMUSCULAR | Status: AC | PRN
  Administered 2015-05-17: 100 mL via INTRAVENOUS

## 2015-05-17 MED ORDER — IOHEXOL 300 MG/ML  SOLN
50.0000 mL | Freq: Once | INTRAMUSCULAR | Status: AC | PRN
  Administered 2015-05-17: 5 mL via INTRAVENOUS

## 2015-05-22 ENCOUNTER — Ambulatory Visit (HOSPITAL_COMMUNITY): Payer: BLUE CROSS/BLUE SHIELD

## 2015-06-04 ENCOUNTER — Ambulatory Visit: Payer: BLUE CROSS/BLUE SHIELD | Admitting: Gastroenterology

## 2016-10-04 IMAGING — CT CT ABD-PELV W/ CM
2 of 4 series · 16 of 46 positions shown, 18 images · IV contrast (APPLIED)
Comparison: None.

CLINICAL DATA: Periumbilical pain, onset 3 days ago.

EXAM:
CT ABDOMEN AND PELVIS WITH CONTRAST
TECHNIQUE: Multidetector CT imaging of the abdomen and pelvis was performed
using the standard protocol following bolus administration of
intravenous contrast.
CONTRAST:  100mL OMNIPAQUE IOHEXOL 300 MG/ML  SOLN

[Series 2: abd/pelvis 5.0 b31f · axial · 0.75mm/px · z∈[-537,-42]mm · 13 of 109 slices shown, 15 images]
[im 5/109  soft-tissue]
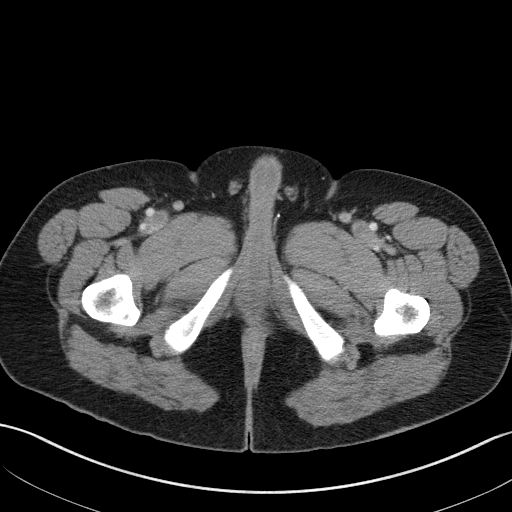
[im 5/109  bone]
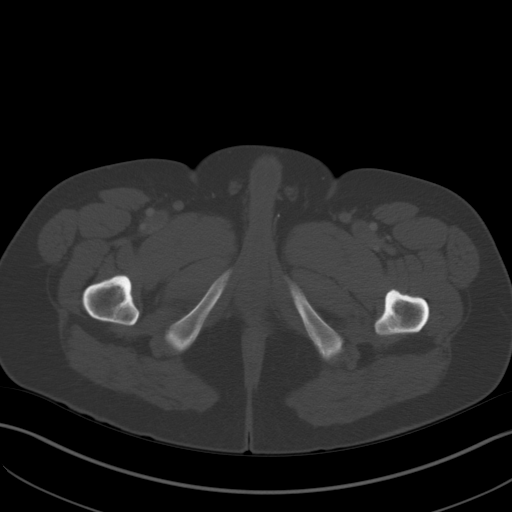
[im 14/109  soft-tissue]
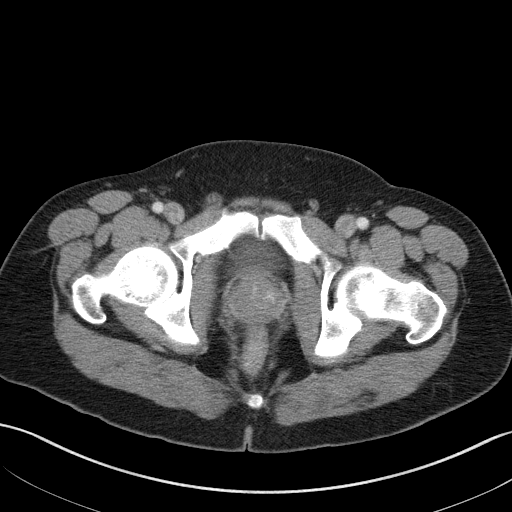
[im 23/109  soft-tissue]
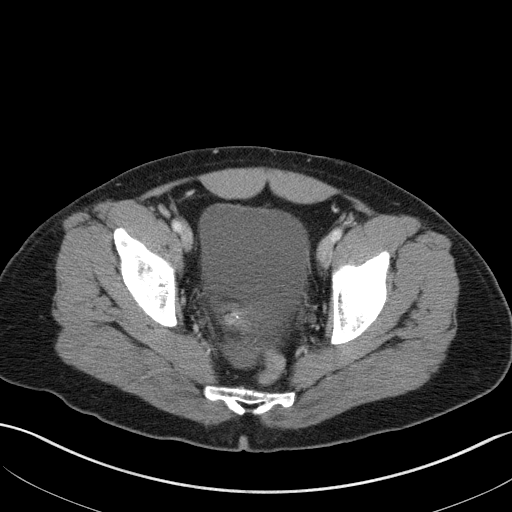
[im 32/109  soft-tissue]
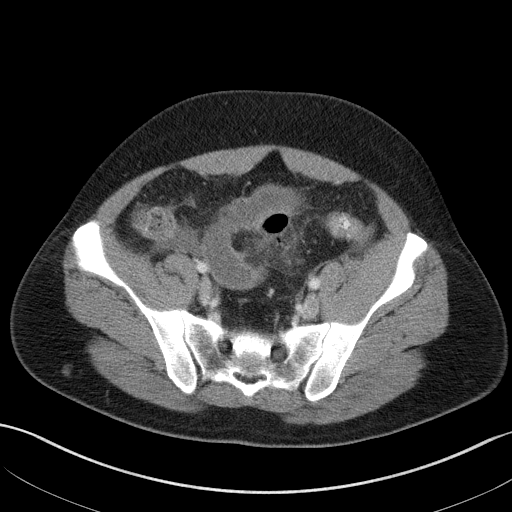
[im 37/109  soft-tissue]
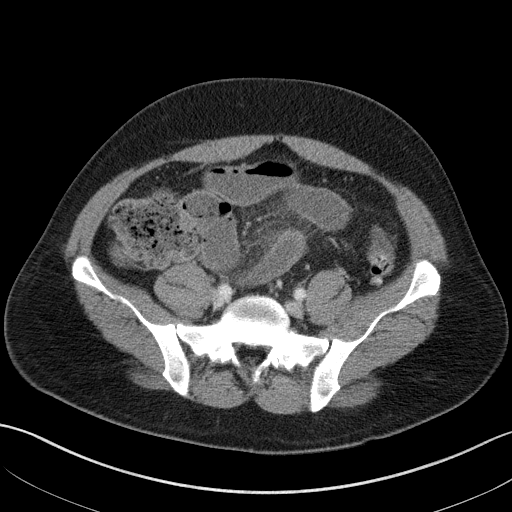
[im 46/109  soft-tissue]
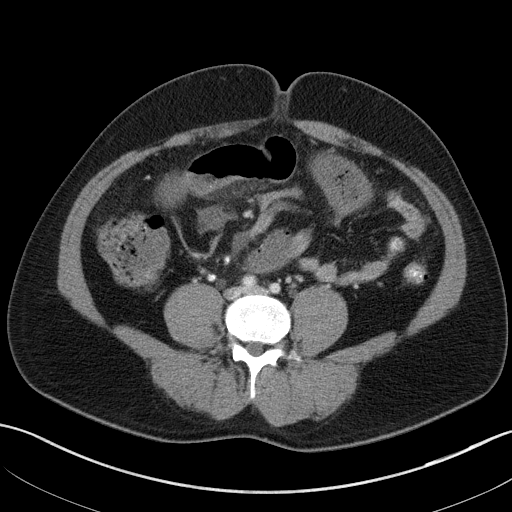
[im 55/109  soft-tissue]
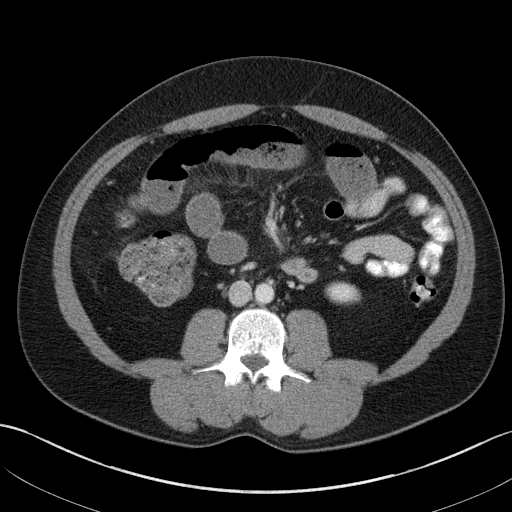
[im 64/109  soft-tissue]
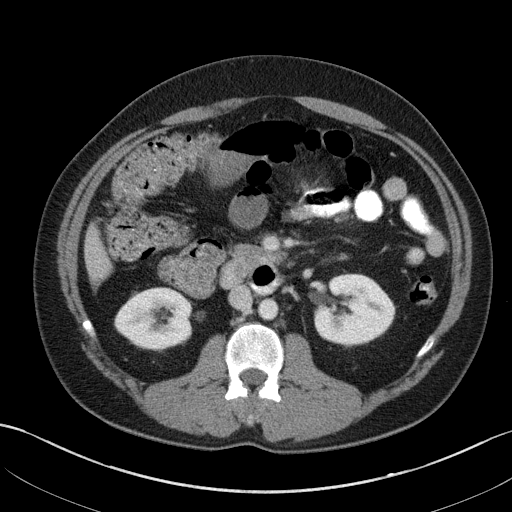
[im 73/109  soft-tissue]
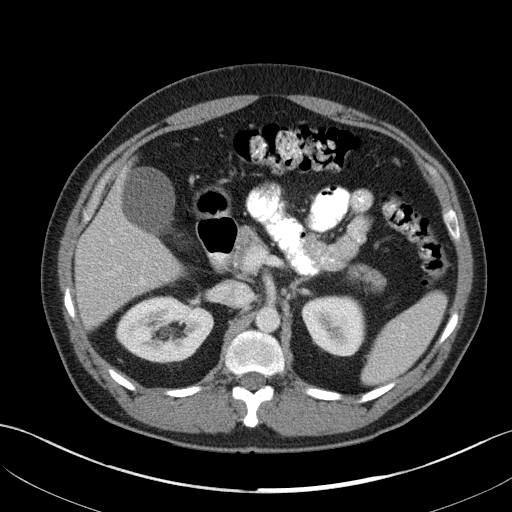
[im 73/109  bone]
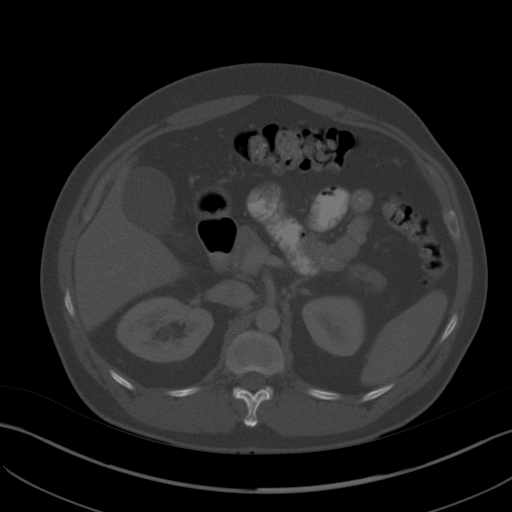
[im 77/109  soft-tissue]
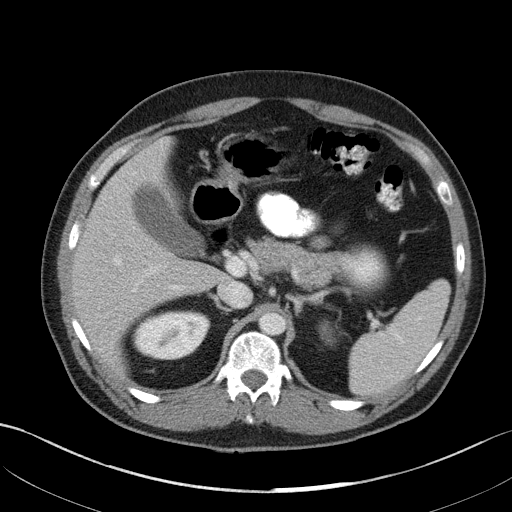
[im 86/109  soft-tissue]
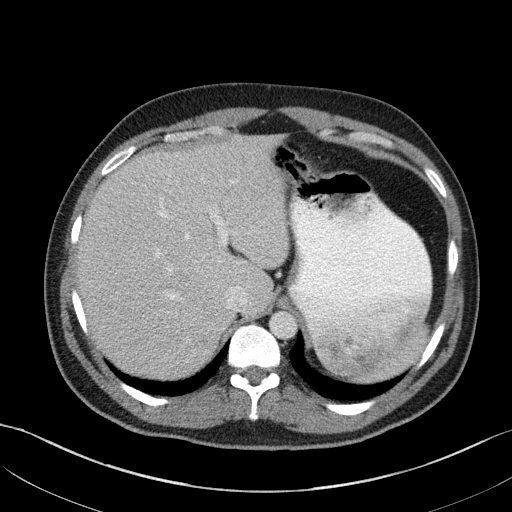
[im 95/109  soft-tissue]
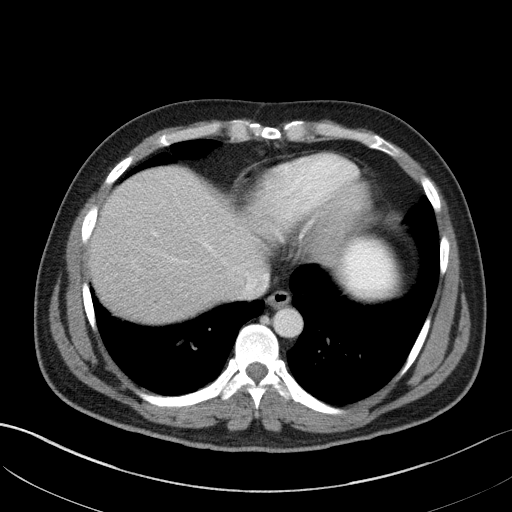
[im 104/109  soft-tissue]
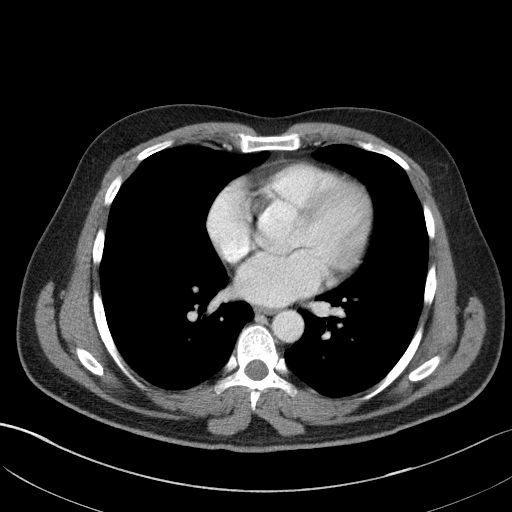

[Series 5: abd/pelvis 3.0 coronal · coronal · 0.87mm/px · 3 of 98 slices shown]
[im 33/98  soft-tissue]
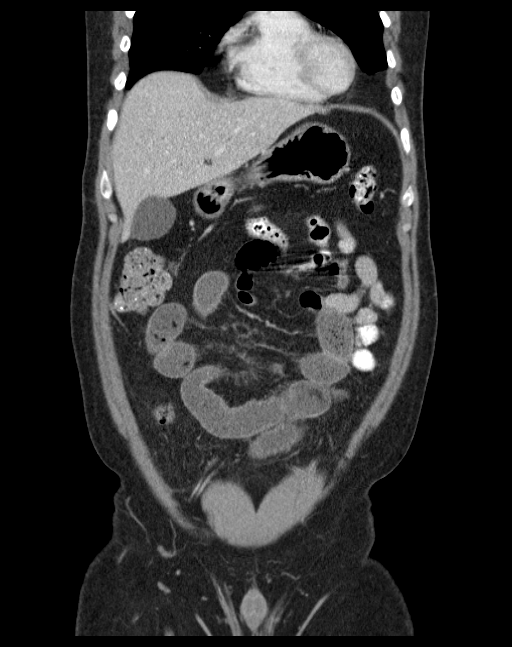
[im 44/98  soft-tissue]
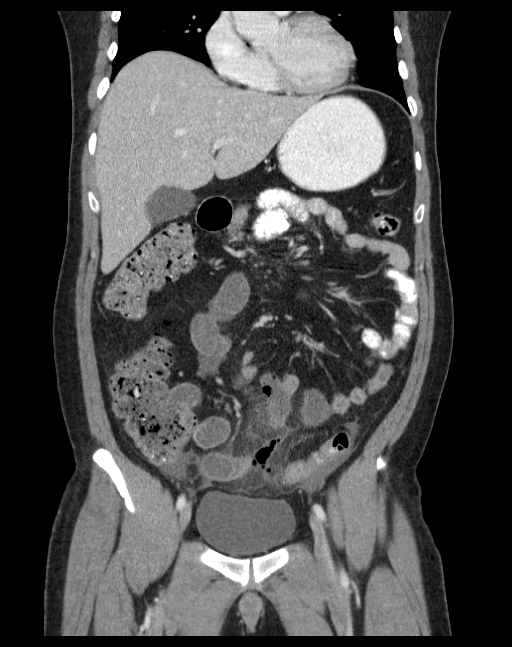
[im 54/98  soft-tissue]
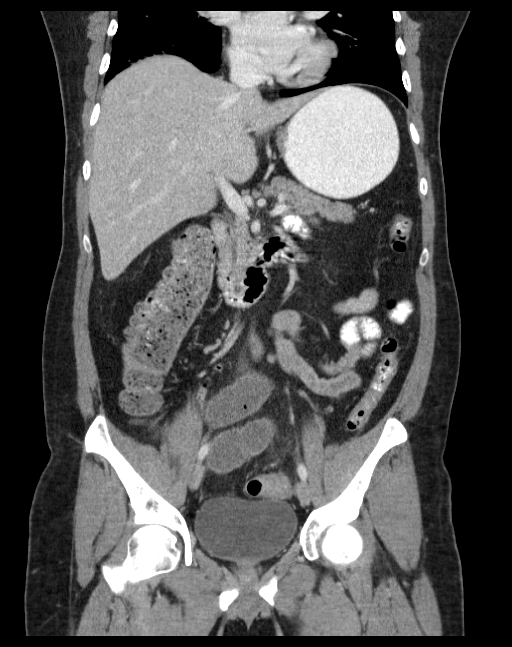

[16 of 46 positions shown; findings below may reference images not displayed]

FINDINGS: BODY WALL: No contributory findings.

LOWER CHEST: No contributory findings.

ABDOMEN/PELVIS:

Liver: No focal abnormality.

Biliary: No evidence of biliary obstruction or stone.

Pancreas: Unremarkable.

Spleen: Unremarkable.

Adrenals: Unremarkable.

Kidneys and ureters: 3 left renal calculi, up to 3 mm. No
hydronephrosis or ureteral calculus

Bladder: Unremarkable.

Reproductive: No pathologic findings.

Bowel: There is thickening of the sigmoid colon where there are
inflamed diverticula and extraluminal gas measuring 2 cm in the
interloop peritoneal compartment. The surrounding fat is inflamed
and the regional small bowel loops are dilated with fecalized
contents. There is mild small bowel wall thickening at the level of
the gas, compatible with a focal reactive enteritis. Primary small
bowel perforation is considered less likely given the overall
appearance. Additionally, no foreign body is seen. No remote
pneumoperitoneum. Small ascites without loculation. No appendicitis.

Vascular: No acute abnormality.

OSSEOUS: No acute abnormalities.

These results were called by telephone at the time of interpretation
on 04/07/2015 at [DATE] to Dr. PAULUS N CEEJAY , who verbally
acknowledged these results.
IMPRESSION: 1. Sigmoid diverticulitis with contained gaseous perforation. There
is reactive enteritis and focal ileus.
2. Left nephrolithiasis.

## 2017-04-13 ENCOUNTER — Ambulatory Visit (INDEPENDENT_AMBULATORY_CARE_PROVIDER_SITE_OTHER): Payer: Self-pay | Admitting: Orthopaedic Surgery

## 2017-04-14 ENCOUNTER — Encounter (INDEPENDENT_AMBULATORY_CARE_PROVIDER_SITE_OTHER): Payer: Self-pay | Admitting: Orthopaedic Surgery

## 2017-04-14 ENCOUNTER — Ambulatory Visit (INDEPENDENT_AMBULATORY_CARE_PROVIDER_SITE_OTHER): Payer: BLUE CROSS/BLUE SHIELD | Admitting: Orthopaedic Surgery

## 2017-04-14 DIAGNOSIS — G8929 Other chronic pain: Secondary | ICD-10-CM | POA: Insufficient documentation

## 2017-04-14 DIAGNOSIS — M545 Low back pain, unspecified: Secondary | ICD-10-CM | POA: Insufficient documentation

## 2017-04-14 MED ORDER — PREDNISONE 10 MG (21) PO TBPK: ORAL_TABLET | ORAL | 0 refills | Status: AC

## 2017-04-14 MED ORDER — METHOCARBAMOL 500 MG PO TABS: 500.0000 mg | ORAL_TABLET | Freq: Four times a day (QID) | ORAL | 2 refills | Status: AC | PRN

## 2017-04-14 NOTE — Progress Notes (Signed)
   Office Visit Note   Patient: Jonathan Buckley           Date of Birth: 03/11/1976           MRN: [id number] Visit Date: 04/14/2017              Requested by: No referring provider defined for this encounter. PCP: Patient, No Pcp Per   Assessment & Plan: Visit Diagnoses:  1. Chronic midline low back pain without sciatica     Plan: Referral to physical therapy was given today. Continue with ibuprofen. I gave him prescription for prednisone and Robaxin. Out of work for 2 weeks. He works as a Location managermachine operator. I'll see him back as needed. If not better may consider MRI.  Follow-Up Instructions: Return if symptoms worsen or fail to improve.   Orders:  No orders of the defined types were placed in this encounter.  Meds ordered this encounter  Medications  . methocarbamol (ROBAXIN) 500 MG tablet    Sig: Take 1 tablet (500 mg total) by mouth every 6 (six) hours as needed for muscle spasms.    Dispense:  30 tablet    Refill:  2  . predniSONE (STERAPRED UNI-PAK 21 TAB) 10 MG (21) TBPK tablet    Sig: Take as directed    Dispense:  21 tablet    Refill:  0      Procedures: No procedures performed   Clinical Data: No additional findings.   Subjective: No chief complaint on file.   Patient comes back today for worsening of his mid and low back pain. He states that last time we saw him he did do physical therapy help but worse after he was diagnosed with a perforated colon had a colectomy. He denies any new numbness or tingling in his legs or any focal deficits.    Review of Systems  Constitutional: Negative.   All other systems reviewed and are negative.    Objective: Vital Signs: There were no vitals taken for this visit.  Physical Exam  Constitutional: He is oriented to person, place, and time. He appears well-developed and well-nourished.  Pulmonary/Chest: Effort normal.  Abdominal: Soft.  Neurological: He is alert and oriented to person, place, and time.  Skin:  Skin is warm.  Psychiatric: He has a normal mood and affect. His behavior is normal. Judgment and thought content normal.  Nursing note and vitals reviewed.   Ortho Exam Low back exam is essentially benign other than mild tenderness in the lumbar region. Specialty Comments:  No specialty comments available.  Imaging: No results found.   PMFS History: Patient Active Problem List   Diagnosis Date Noted  . Chronic midline low back pain without sciatica 04/14/2017  . 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 Diverticulitis of sigmoid colon with perforation 04/07/2015   Past Medical History:  Diagnosis Date  . Back ache   . Diverticulitis of sigmoid colon with perforation 04/07/2015    No family history on file.  No past surgical history on file. Social History   Occupational History  . Not on file.   Social History Main Topics  . Smoking status: Never Smoker  . Smokeless tobacco: Not on file  . Alcohol use No  . Drug use: No  . Sexual activity: Not on file

## 2017-04-27 ENCOUNTER — Ambulatory Visit (INDEPENDENT_AMBULATORY_CARE_PROVIDER_SITE_OTHER): Payer: BLUE CROSS/BLUE SHIELD | Admitting: Orthopaedic Surgery

## 2017-04-28 ENCOUNTER — Encounter (INDEPENDENT_AMBULATORY_CARE_PROVIDER_SITE_OTHER): Payer: Self-pay | Admitting: Orthopaedic Surgery

## 2017-04-28 ENCOUNTER — Ambulatory Visit: Payer: BLUE CROSS/BLUE SHIELD | Admitting: Physical Therapy

## 2017-04-28 ENCOUNTER — Ambulatory Visit (INDEPENDENT_AMBULATORY_CARE_PROVIDER_SITE_OTHER): Payer: BLUE CROSS/BLUE SHIELD | Admitting: Orthopaedic Surgery

## 2017-04-28 DIAGNOSIS — G8929 Other chronic pain: Secondary | ICD-10-CM

## 2017-04-28 DIAGNOSIS — M545 Low back pain: Secondary | ICD-10-CM | POA: Diagnosis not present

## 2017-04-28 NOTE — Progress Notes (Signed)
   Office Visit Note   Patient: Jonathan Buckley           Date of Birth: Jan 21, 1976           MRN: [id number] Visit Date: 04/28/2017              Requested by: No referring provider defined for this encounter. PCP: Patient, No Pcp Per   Assessment & Plan: Visit Diagnoses:  1. Chronic midline low back pain without sciatica     Plan: Patient is improving as expected. Continue with physical therapy and rest for 2 more weeks. May return back to work at that time. Follow-up with me as needed.  Follow-Up Instructions: Return if symptoms worsen or fail to improve.   Orders:  No orders of the defined types were placed in this encounter.  No orders of the defined types were placed in this encounter.     Procedures: No procedures performed   Clinical Data: No additional findings.   Subjective: Chief Complaint  Patient presents with  . Lower Back - Pain    Patient follows up today for his back pain. He states that he is better. He is doing physical therapy.. The rest is really helped.    Review of Systems   Objective: Vital Signs: There were no vitals taken for this visit.  Physical Exam  Ortho Exam Exam is stable. Specialty Comments:  No specialty comments available.  Imaging: No results found.   PMFS History: Patient Active Problem List   Diagnosis Date Noted  . Chronic midline low back pain without sciatica 04/14/2017  . 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 Diverticulitis of sigmoid colon with perforation 04/07/2015   Past Medical History:  Diagnosis Date  . Back ache   . Diverticulitis of sigmoid colon with perforation 04/07/2015    No family history on file.  No past surgical history on file. Social History   Occupational History  . Not on file.   Social History Main Topics  . Smoking status: Never Smoker  . Smokeless tobacco: Never Used  . Alcohol use No  . Drug use: No  . Sexual activity: Not on file

## 2017-05-01 ENCOUNTER — Ambulatory Visit: Payer: BLUE CROSS/BLUE SHIELD | Attending: Orthopaedic Surgery | Admitting: Physical Therapy

## 2017-05-01 DIAGNOSIS — G8929 Other chronic pain: Secondary | ICD-10-CM | POA: Insufficient documentation

## 2017-05-01 DIAGNOSIS — R29898 Other symptoms and signs involving the musculoskeletal system: Secondary | ICD-10-CM | POA: Diagnosis present

## 2017-05-01 DIAGNOSIS — M545 Low back pain, unspecified: Secondary | ICD-10-CM

## 2017-05-01 DIAGNOSIS — R293 Abnormal posture: Secondary | ICD-10-CM | POA: Insufficient documentation

## 2017-05-01 NOTE — Patient Instructions (Signed)
Hamstring Step 2   Left foot relaxed, knee straight, other leg bent, foot flat. Raise straight leg further upward to maximal range. Hold _30__ seconds. Relax leg completely down. Repeat _3__ times.  Bridging   Slowly raise buttocks from floor, keeping stomach tight. Repeat __15__ times per set. Do __2__ sets per session.    Pelvic Tilt: Posterior - Legs Bent (Supine)   Tighten stomach and flatten back by rolling pelvis down. Hold _5___ seconds. Relax. Repeat _15___ times per set. Do __2__ sets per session.    Single knee to chest  Hug in one knee to chest - 5 x 10-20 seconds each  Lower Trunk rotation   With knees bent and feet flat - rotate hips - 10 x 10 seconds each side  Straight Leg Raise   Tighten stomach and slowly raise locked right leg __8__ inches from floor. Repeat __15__ times per set. Do __2__ sets per session.

## 2017-05-01 NOTE — Therapy (Signed)
[facility] [address]  [address] [place], [place], [address] Phone: [phone]   Fax:  [phone]  Physical Therapy Evaluation  Patient Details  Name: Jonathan Buckley MRN: [id number] Date of Birth: 02-05-1976 Referring Provider: Dr. Roda Shutters  Encounter Date: 05/01/2017      PT End of Session - 05/01/17 0824    Visit Number 1   Number of Visits 12   Date for PT Re-Evaluation 06/12/17   PT Start Time 0800   PT Stop Time 0833   PT Time Calculation (min) 33 min   Activity Tolerance Patient tolerated treatment well   Behavior During Therapy [facility] for tasks assessed/performed      Past Medical History:  Diagnosis Date  . Back ache   . Diverticulitis of sigmoid colon with perforation 04/07/2015    No past surgical history on file.  There were no vitals filed for this visit.       Subjective Assessment - 05/01/17 0801    Subjective Patient reporting "back problem" for 2-3 years; getting worse the past 5-6 months. Has had PT back in 2016. Has been seeing ortho MD - thinks this is more a muscular problem. Job requirement require heavy lifting, pushing, and pulling which patient thinks is contributing to pain. Also feels like he needs a new mattress. Currently out of work will return on 05/11/17. Denies N&T as well as bowel and bladder issues.    Diagnostic tests none   Patient Stated Goals improve pain and mobility   Currently in Pain? Yes   Pain Score 6    Pain Location Back   Pain Orientation Lower   Pain Descriptors / Indicators Stabbing   Pain Type Chronic pain   Pain Onset More than a month ago   Pain Frequency Constant   Aggravating Factors  work duties   Pain Relieving Factors ice            [facility] PT Assessment - 05/01/17 0805      Assessment   Medical Diagnosis LBP   Referring Provider Dr. Roda Shutters   Onset Date/Surgical Date --  2-3 years   Next MD Visit prn   Prior Therapy yes - 2016     Precautions    Precautions None     Restrictions   Weight Bearing Restrictions No     Balance Screen   Has the patient fallen in the past 6 months No   Has the patient had a decrease in activity level because of a fear of falling?  No   Is the patient reluctant to leave their home because of a fear of falling?  No     Home Environment   Living Environment Private residence   Type of Home House     Prior Function   Level of Independence Independent   Vocation Full time employment   Development worker, community - heavy lifting     Cognition   Overall Cognitive Status Within Functional Limits for tasks assessed     Observation/Other Assessments   Focus on Therapeutic Outcomes (FOTO)  --     Sensation   Light Touch Appears Intact     Coordination   Gross Motor Movements are Fluid and Coordinated Yes     Posture/Postural Control   Posture/Postural Control Postural limitations   Postural Limitations Rounded Shoulders;Forward head     ROM / Strength   AROM / PROM / Strength AROM;Strength     AROM  AROM Assessment Site Lumbar   Lumbar Flexion fingertip to anterior ankle   Lumbar Extension 50% limited - pain provoking   Lumbar - Right Side Bend WNL   Lumbar - Left Side Bend WNL   Lumbar - Right Rotation 25% limited   Lumbar - Left Rotation WNL     Strength   Overall Strength Comments B LE grossly 4+/5     Flexibility   Soft Tissue Assessment /Muscle Length yes   Hamstrings B tightness     Palpation   Spinal mobility noted guarding of B paraspinals with gentle CPAs of L-spine   Palpation comment some tenderness to palpation over L-sided lumbar paraspinals     Special Tests    Special Tests Lumbar   Lumbar Tests Straight Leg Raise     Straight Leg Raise   Findings Negative   Side  --  bilateral            Objective measurements completed on examination: See above findings.          [facility] Adult PT Treatment/Exercise - 05/01/17 0805      Exercises    Exercises Lumbar     Lumbar Exercises: Stretches   Passive Hamstring Stretch 3 reps;30 seconds   Passive Hamstring Stretch Limitations B - supine with strap   Single Knee to Chest Stretch Limitations B - 10 x 10 seconds   Lower Trunk Rotation Limitations B - 10 x 10 seconds     Lumbar Exercises: Supine   Ab Set 15 reps;5 seconds   AB Set Limitations hevay VC and TC for core engagement    Bridge 15 reps;5 seconds   Straight Leg Raise 15 reps   Straight Leg Raises Limitations B LE                PT Education - 05/01/17 0824    Education provided Yes   Education Details exam findings, POC, HEP   Person(s) Educated Patient   Methods Explanation;Demonstration;Handout   Comprehension Returned demonstration;Need further instruction          PT Short Term Goals - 05/01/17 1610      PT SHORT TERM GOAL #1   Title Patient to be independent with initial HEP (05/15/17)   Status New           PT Long Term Goals - 05/01/17 0834      PT LONG TERM GOAL #1   Title Patient to be independent with advanced HEP (06/12/17)   Status New     PT LONG TERM GOAL #2   Title Patient to demonstrate good postural alignment and body mechanics needed for work and leisure activities as applicable to reduce risk for reinjury (06/12/17)   Status New     PT LONG TERM GOAL #3   Title Patient to demonstrate lumbar AROM WNL in all planes without pain limiting (06/12/17)   Status New     PT LONG TERM GOAL #4   Title Patient to report ability to perform work related tasks as well as leisure/household activiites without pain limiting function (06/12/17)   Status New                Plan - 05/01/17 0826    Clinical Impression Statement Patient is a 41 y/o male presenting to OPPT today for low complexity evaluation regarding primary complaints of low back pain that has persisted for some time - with no known mechanism of injury. Patient today with near full AROM of  lumbar spine in all directions  but does report L-sided LBP with all active movements. Patient with some weakness in proximal hip musculature as well as core, as well as some B tightness in his HS. Patient to benefit form PT to address pain and functional limtations to allow patinet to return to full time work duties and leisure activities at full function and reduced risk for re-injury.    Clinical Presentation Stable   Clinical Presentation due to: no radicular symptoms   Clinical Decision Making Low   Rehab Potential Good   PT Frequency 2x / week   PT Duration 6 weeks   PT Treatment/Interventions ADLs/Self Care Home Management;Cryotherapy;Electrical Stimulation;Iontophoresis 4mg /ml Dexamethasone;Moist Heat;Traction;Ultrasound;Neuromuscular re-education;Balance training;Therapeutic exercise;Therapeutic activities;Functional mobility training;Patient/family education;Manual techniques;Passive range of motion;Vasopneumatic Device;Taping;Dry needling   Consulted and Agree with Plan of Care Patient      Patient will benefit from skilled therapeutic intervention in order to improve the following deficits and impairments:  Decreased activity tolerance, Decreased range of motion, Decreased mobility, Decreased strength, Postural dysfunction, Pain  Visit Diagnosis: Chronic low back pain without sciatica, unspecified back pain laterality - Plan: PT plan of care cert/re-cert  Abnormal posture - Plan: PT plan of care cert/re-cert  Other symptoms and signs involving the musculoskeletal system - Plan: PT plan of care cert/re-cert     Problem List Patient Active Problem List   Diagnosis Date Noted  . Chronic midline low back pain without sciatica 04/14/2017  . 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 Diverticulitis of sigmoid colon with perforation 04/07/2015     Jonathan Buckley, PT, DPT 05/01/17 8:42 AM   [facility] [address]  [address] [place], [place],  [id number] Phone: [phone]   Fax:  [phone]  Name: Jonathan Jocksif Mccance MRN: [id number] Date of Birth: 22-Sep-1976

## 2017-05-06 ENCOUNTER — Ambulatory Visit: Payer: BLUE CROSS/BLUE SHIELD | Admitting: Physical Therapy

## 2017-05-06 DIAGNOSIS — G8929 Other chronic pain: Secondary | ICD-10-CM

## 2017-05-06 DIAGNOSIS — M545 Low back pain: Secondary | ICD-10-CM | POA: Diagnosis not present

## 2017-05-06 DIAGNOSIS — R29898 Other symptoms and signs involving the musculoskeletal system: Secondary | ICD-10-CM

## 2017-05-06 DIAGNOSIS — R293 Abnormal posture: Secondary | ICD-10-CM

## 2017-05-06 NOTE — Patient Instructions (Signed)
Cat / Cow Flow    Inhale, press spine toward ceiling like a Halloween cat. Keeping strength in arms and abdominals, exhale to soften spine through neutral and into cow pose. Open chest and arch back. Initiate movement between cat and cow at tailbone, one vertebrae at a time. Repeat __10-15__ times.

## 2017-05-06 NOTE — Therapy (Addendum)
[facility] [address]  [place], [place], [address] Phone: [phone]   Fax:  [phone]  Physical Therapy Treatment  Patient Details  Name: Garry Nicolini MRN: [id number] Date of Birth: [birth year] Referring Provider: Dr. Erlinda Hong  Encounter Date: 05/06/2017      PT End of Session - 05/06/17 0802    Visit Number 2   Number of Visits 12   Date for PT Re-Evaluation 06/12/17   PT Start Time 0800   Activity Tolerance Patient tolerated treatment well   Behavior During Therapy [facility] for tasks assessed/performed      Past Medical History:  Diagnosis Date  . Back ache   . Diverticulitis of sigmoid colon with perforation 04/07/2015    No past surgical history on file.  There were no vitals filed for this visit.      Subjective Assessment - 05/06/17 0801    Subjective "this morning was tough, like every morning; but overall I'm feeling okay"   Patient Stated Goals improve pain and mobility   Currently in Pain? Yes   Pain Score 3    Pain Location Back   Pain Orientation Lower;Medial   Pain Descriptors / Indicators Aching;Dull;Sore   Pain Type Chronic pain                         [facility] Adult PT Treatment/Exercise - 05/06/17 0803      Lumbar Exercises: Aerobic   Stationary Bike NuStep: L4 x 6 minutes     Lumbar Exercises: Standing   Row Both;15 reps;Theraband   Theraband Level (Row) Level 2 (Red)   Other Standing Lumbar Exercises pallof press - single red tband x 10 each side     Lumbar Exercises: Supine   Ab Set 10 reps;5 seconds   Clam 15 reps   Clam Limitations red tband at knees; ab set prior to movement   Bent Knee Raise 15 reps;5 seconds   Bent Knee Raise Limitations with ab set prior to movement - VC and TC for good core engagement    Bridge 15 reps   Bridge Limitations red tband at knees   Straight Leg Raise 15 reps   Straight Leg Raises Limitations 2# - B LE     Lumbar Exercises:  Sidelying   Clam 15 reps;3 seconds   Clam Limitations red tband; bilateral   Other Sidelying Lumbar Exercises open book - 10 x 10 each side     Lumbar Exercises: Prone   Other Prone Lumbar Exercises childs pose x 60 seconds     Lumbar Exercises: Quadruped   Madcat/Old Horse 10 reps   Madcat/Old Horse Limitations very difficult to isolate movements - wants to get motion from scapula and neck                  PT Short Term Goals - 05/01/17 2355      PT SHORT TERM GOAL #1   Title Patient to be independent with initial HEP (05/15/17)   Status New           PT Long Term Goals - 05/01/17 7322      PT LONG TERM GOAL #1   Title Patient to be independent with advanced HEP (06/12/17)   Status New     PT LONG TERM GOAL #2   Title Patient to demonstrate good postural alignment and body mechanics needed for work and leisure activities as applicable to reduce risk  for reinjury (06/12/17)   Status New     PT LONG TERM GOAL #3   Title Patient to demonstrate lumbar AROM WNL in all planes without pain limiting (06/12/17)   Status New     PT LONG TERM GOAL #4   Title Patient to report ability to perform work related tasks as well as leisure/household activiites without pain limiting function (06/12/17)   Status New               Plan - 05/06/17 0802    Clinical Impression Statement PT session today focusing on core engagement, hip strengthening, and general posture work. Patient requiring continued education and demonstration for good core activation throughout session. Cat/cow attempted today with apteint demonstrating great difficulty isolating movements at spine, with tendency to flex/extend cervical spine to attempt to produce movement. Will continue to progress core/hip strengthening as able.    PT Treatment/Interventions ADLs/Self Care Home Management;Cryotherapy;Electrical Stimulation;Iontophoresis 80m/ml Dexamethasone;Moist Heat;Traction;Ultrasound;Neuromuscular  re-education;Balance training;Therapeutic exercise;Therapeutic activities;Functional mobility training;Patient/family education;Manual techniques;Passive range of motion;Vasopneumatic Device;Taping;Dry needling   PT Next Visit Plan hip/core strengthening   Consulted and Agree with Plan of Care Patient      Patient will benefit from skilled therapeutic intervention in order to improve the following deficits and impairments:  Decreased activity tolerance, Decreased range of motion, Decreased mobility, Decreased strength, Postural dysfunction, Pain  Visit Diagnosis: Chronic low back pain without sciatica, unspecified back pain laterality  Abnormal posture  Other symptoms and signs involving the musculoskeletal system     Problem List Patient Active Problem List   Diagnosis Date Noted  . Chronic midline low back pain without sciatica 04/14/2017  . [place]   . Diverticulitis of sigmoid colon with perforation 04/07/2015    SLanney Gins PT, DPT 05/06/17 8:41 AM  PHYSICAL THERAPY DISCHARGE SUMMARY  Visits from Start of Care: 2  Current functional level related to goals / functional outcomes: See above; little progress due to only presenting to eval and 1 follow-up   Remaining deficits: See above   Education / Equipment:  HEP  Plan: Patient agrees to discharge.  Patient goals were not met. Patient is being discharged due to not returning since the last visit.  ?????    SLanney Gins PT, DPT 06/03/17 8:28 AM    [facility] [address]Phone: [phone]  Fax:  [phone] Name: AWinton OffordMRN: [id number] of Birth: [birth year]

## 2017-05-08 ENCOUNTER — Ambulatory Visit: Payer: BLUE CROSS/BLUE SHIELD

## 2017-05-12 ENCOUNTER — Ambulatory Visit: Payer: BLUE CROSS/BLUE SHIELD
